# Patient Record
Sex: Female | Born: 1970
Health system: Southern US, Community
[De-identification: ages and names within clinical notes are randomized; demographics above are authoritative.]

## PROBLEM LIST (undated history)

## (undated) DIAGNOSIS — I10 Essential (primary) hypertension: Secondary | ICD-10-CM

## (undated) DIAGNOSIS — Z8639 Personal history of other endocrine, nutritional and metabolic disease: Secondary | ICD-10-CM

## (undated) DIAGNOSIS — D649 Anemia, unspecified: Secondary | ICD-10-CM

## (undated) HISTORY — DX: Hypocalcemia: E83.51

## (undated) HISTORY — PX: FOOT SURGERY: SHX648

## (undated) HISTORY — DX: Anemia, unspecified: D64.9

## (undated) HISTORY — DX: Personal history of other endocrine, nutritional and metabolic disease: Z86.39

## (undated) HISTORY — PX: OTHER SURGICAL HISTORY: SHX169

---

## 2015-11-13 LAB — CBC AND DIFFERENTIAL
HEMOGLOBIN: 9.9 — AB (ref 12.0–16.0)
WBC: 12.7

## 2016-01-05 ENCOUNTER — Emergency Department (HOSPITAL_BASED_OUTPATIENT_CLINIC_OR_DEPARTMENT_OTHER)
Admission: EM | Admit: 2016-01-05 | Discharge: 2016-01-05 | Disposition: A | Discharge status: Home / Self Care | Payer: Self-pay | Attending: Emergency Medicine | Admitting: Emergency Medicine

## 2016-01-05 ENCOUNTER — Encounter (HOSPITAL_BASED_OUTPATIENT_CLINIC_OR_DEPARTMENT_OTHER): Payer: Self-pay | Admitting: Emergency Medicine

## 2016-01-05 DIAGNOSIS — Z79899 Other long term (current) drug therapy: Secondary | ICD-10-CM | POA: Insufficient documentation

## 2016-01-05 DIAGNOSIS — I10 Essential (primary) hypertension: Secondary | ICD-10-CM | POA: Insufficient documentation

## 2016-01-05 DIAGNOSIS — R609 Edema, unspecified: Secondary | ICD-10-CM

## 2016-01-05 DIAGNOSIS — R0602 Shortness of breath: Secondary | ICD-10-CM | POA: Insufficient documentation

## 2016-01-05 DIAGNOSIS — R600 Localized edema: Secondary | ICD-10-CM | POA: Insufficient documentation

## 2016-01-05 HISTORY — DX: Essential (primary) hypertension: I10

## 2016-01-05 NOTE — ED Provider Notes (Signed)
CSN: [id number]     Arrival date & time 01/05/16  1624 History  By signing my name below, I, Rowan Blase, attest that this documentation has been prepared under the direction and in the presence of Leo Grosser, MD . Electronically Signed: Rowan Blase, Scribe. 01/05/2016. 5:16 PM.    Chief Complaint  Patient presents with  . Foot Pain    The history is provided by the patient. No language interpreter was used.   HPI Comments:  Mia Carlson is a 45 y.o. female with PMHx of HTN who presents to the Emergency Department complaining of recurrent right foot pain and swelling. Pt notes h/o injury to right foot 3 years ago. She recently went back to work and reports she has been standing for ~12 hours. Pt states she is not drinking much water while at work. No alleviating factors noted; she has worn and ankle sleeve without relief. Pt reports chronic shortness of breath when walking up stairs and laying flat. She states her blood pressure is "up and down"; she was recently started on amlodipine by her PCP. Denies h/o heart or lung problems, or abdominal pain.   Past Medical History  Diagnosis Date  . Hypertension    Past Surgical History  Procedure Laterality Date  . Foot surgery Right    History reviewed. No pertinent family history. Social History  Substance Use Topics  . Smoking status: Never Smoker   . Smokeless tobacco: None  . Alcohol Use: No   OB History    No data available     Review of Systems  Respiratory: Positive for shortness of breath.   Gastrointestinal: Negative for abdominal pain.  Musculoskeletal: Positive for joint swelling and arthralgias.  All other systems reviewed and are negative.  Allergies  Review of patient's allergies indicates not on file.  Home Medications   Prior to Admission medications   Medication Sig Start Date End Date Taking? Authorizing Provider  amLODipine-benazepril (LOTREL) 10-20 MG capsule Take 1 capsule by mouth daily.    Yes Historical Provider, MD   BP 169/79 mmHg  Pulse 85  Temp(Src) 98.4 F (36.9 C) (Oral)  Resp 18  Ht 5\' 3"  (1.6 m)  Wt 247 lb (112.038 kg)  BMI 43.76 kg/m2  SpO2 100%  LMP 12/03/2015   Physical Exam  Constitutional: She is oriented to person, place, and time. She appears well-developed and well-nourished. No distress.  HENT:  Head: Normocephalic.  Eyes: Conjunctivae are normal.  Neck: Neck supple. No tracheal deviation present.  Cardiovascular: Normal rate and regular rhythm.   Pulmonary/Chest: Effort normal. No respiratory distress.  Abdominal: Soft. She exhibits no distension.  Musculoskeletal:  1+ BL pitting edema of ankles  Neurological: She is alert and oriented to person, place, and time.  Skin: Skin is warm and dry.  Psychiatric: She has a normal mood and affect.    ED Course  Procedures  DIAGNOSTIC STUDIES:  Oxygen Saturation is 100% on RA, normal by my interpretation.    COORDINATION OF CARE:  5:11 PM Recommended pt wear compression stockings while working and elevate her feet at night. Will refer to new PCP. Discussed treatment plan with pt at bedside and pt agreed to plan.  Labs Review Labs Reviewed - No data to display  Imaging Review No results found. I have personally reviewed and evaluated these images and lab results as part of my medical decision-making.   EKG Interpretation None      MDM   Final diagnoses:  Peripheral edema  45 y.o. female presents with bilateral leg swelling and post-surgical right ankle that [place] more swollen likely 2/2 scar tissue but edema is evident on both legs equally. Suspect she is standing more with work and this represents stasis. No evidence of DVT. No evidence of heart failure or renal failure with clear explanation. Compression stockings and elevation at night recommended. Plan to follow up with PCP as needed and return precautions discussed for worsening or new concerning symptoms.   I personally performed  the services described in this documentation, which was scribed in my presence. The recorded information has been reviewed and is accurate.     Leo Grosser, MD 01/06/16 [phone]

## 2016-01-05 NOTE — Discharge Instructions (Signed)
Edema °Edema is an abnormal buildup of fluids in your body tissues. Edema is somewhat dependent on gravity to pull the fluid to the lowest place in your body. That makes the condition more common in the legs and thighs (lower extremities). Painless swelling of the feet and ankles is common and becomes more likely as you get older. It is also common in looser tissues, like around your eyes.  °When the affected area is squeezed, the fluid may move out of that spot and leave a dent for a few moments. This dent is called pitting.  °CAUSES  °There are many possible causes of edema. Eating too much salt and being on your feet or sitting for a long time can cause edema in your legs and ankles. Hot weather may make edema worse. Common medical causes of edema include: °· Heart failure. °· Liver disease. °· Kidney disease. °· Weak blood vessels in your legs. °· Cancer. °· An injury. °· Pregnancy. °· Some medications. °· Obesity.  °SYMPTOMS  °Edema is usually painless. Your skin may look swollen or shiny.  °DIAGNOSIS  °Your health care provider may be able to diagnose edema by asking about your medical history and doing a physical exam. You may need to have tests such as X-rays, an electrocardiogram, or blood tests to check for medical conditions that may cause edema.  °TREATMENT  °Edema treatment depends on the cause. If you have heart, liver, or kidney disease, you need the treatment appropriate for these conditions. General treatment may include: °· Elevation of the affected body part above the level of your heart. °· Compression of the affected body part. Pressure from elastic bandages or support stockings squeezes the tissues and forces fluid back into the blood vessels. This keeps fluid from entering the tissues. °· Restriction of fluid and salt intake. °· Use of a water pill (diuretic). These medications are appropriate only for some types of edema. They pull fluid out of your body and make you urinate more often. This  gets rid of fluid and reduces swelling, but diuretics can have side effects. Only use diuretics as directed by your health care provider. °HOME CARE INSTRUCTIONS  °· Keep the affected body part above the level of your heart when you are lying down.   °· Do not sit still or stand for prolonged periods.   °· Do not put anything directly under your knees when lying down. °· Do not wear constricting clothing or garters on your upper legs.   °· Exercise your legs to work the fluid back into your blood vessels. This may help the swelling go down.   °· Wear elastic bandages or support stockings to reduce ankle swelling as directed by your health care provider.   °· Eat a low-salt diet to reduce fluid if your health care provider recommends it.   °· Only take medicines as directed by your health care provider.  °SEEK MEDICAL CARE IF:  °· Your edema is not responding to treatment. °· You have heart, liver, or kidney disease and notice symptoms of edema. °· You have edema in your legs that does not improve after elevating them.   °· You have sudden and unexplained weight gain. °SEEK IMMEDIATE MEDICAL CARE IF:  °· You develop shortness of breath or chest pain.   °· You cannot breathe when you lie down. °· You develop pain, redness, or warmth in the swollen areas.   °· You have heart, liver, or kidney disease and suddenly get edema. °· You have a fever and your symptoms suddenly get worse. °MAKE SURE YOU:  °·   Understand these instructions.  Will watch your condition.  Will get help right away if you are not doing well or get worse.   This information is not intended to replace advice given to you by your health care provider. Make sure you discuss any questions you have with your health care provider.   Document Released: 07/05/2005 Document Revised: 07/26/2014 Document Reviewed: 04/27/2013 Elsevier Interactive Patient Education 2016 [employer]. How to Use Compression Stockings Compression stockings are elastic  socks that squeeze the legs. They help to increase blood flow to the legs, decrease swelling in the legs, and reduce the chance of developing blood clots in the lower legs. Compression stockings are often used by people who:  Are recovering from surgery.  Have poor circulation in their legs.  Are prone to getting blood clots in their legs.  Have varicose veins.  Sit or stay in bed for long periods of time. HOW TO USE COMPRESSION STOCKINGS Before you put on your compression stockings:  Make sure that they are the correct size. If you do not know your size, ask your health care provider.  Make sure that they are clean, dry, and in good condition.  Check them for rips and tears. Do not put them on if they are ripped or torn. Put your stockings on first thing in the morning, before you get out of bed. Keep them on for as long as your health care provider advises. When you are wearing your stockings:  Keep them as smooth as possible. Do not allow them to bunch up. It is especially important to prevent the stockings from bunching up around your toes or behind your knees.  Do not roll the stockings downward and leave them rolled down. This can decrease blood flow to your leg.  Change them right away if they become wet or dirty. When you take off your stockings, inspect your legs and feet. Anything that does not seem normal may require medical attention. Look for:  Open sores.  Red spots.  Swelling. INFORMATION AND TIPS  Do not stop wearing your compression stockings without talking to your health care provider first.  Wash your stockings everyday with mild detergent in cold or warm water. Do not use bleach. Air-dry your stockings or dry them in a clothes dryer on low heat.  Replace your stockings every 3-6 months.  If skin moisturizing is part of your treatment plan, apply lotion or cream at night so that your skin will be dry when you put on the stockings in the morning. It is  harder to put the stockings on when you have lotion on your legs or feet. SEEK MEDICAL CARE IF: Remove your stockings and seek medical care if:  You have a feeling of pins and needles in your feet or legs.  You have any new changes in your skin.  You have skin lesions that are getting worse.  You have swelling or pain that is getting worse. SEEK IMMEDIATE MEDICAL CARE IF:  You have numbness or tingling in your lower legs that does not get better immediately after you take the stockings off.  Your toes or feet become cold and blue.  You develop open sores or red spots on your legs that do not go away.  You see or feel a warm spot on your leg.  You have new swelling or soreness in your leg.  You are short of breath or you have chest pain for no reason.  You have a rapid  or irregular heartbeat.  You feel light-headed or dizzy.   This information is not intended to replace advice given to you by your health care provider. Make sure you discuss any questions you have with your health care provider.   Document Released: 05/02/2009 Document Revised: 11/19/2014 Document Reviewed: 06/12/2014 Elsevier Interactive Patient Education [employer].

## 2016-01-05 NOTE — ED Notes (Signed)
Pt states she injured her right foot 3 years ago and just recently started back to work and foot started swelling and had pain

## 2016-01-05 NOTE — ED Notes (Signed)
Ice applied

## 2017-02-19 ENCOUNTER — Emergency Department (HOSPITAL_COMMUNITY)
Admission: EM | Admit: 2017-02-19 | Discharge: 2017-02-19 | Disposition: A | Discharge status: Home / Self Care | Payer: Self-pay | Attending: Emergency Medicine | Admitting: Emergency Medicine

## 2017-02-19 ENCOUNTER — Encounter (HOSPITAL_COMMUNITY): Payer: Self-pay

## 2017-02-19 DIAGNOSIS — H1033 Unspecified acute conjunctivitis, bilateral: Secondary | ICD-10-CM

## 2017-02-19 DIAGNOSIS — J309 Allergic rhinitis, unspecified: Secondary | ICD-10-CM | POA: Insufficient documentation

## 2017-02-19 DIAGNOSIS — I10 Essential (primary) hypertension: Secondary | ICD-10-CM | POA: Insufficient documentation

## 2017-02-19 DIAGNOSIS — J3089 Other allergic rhinitis: Secondary | ICD-10-CM

## 2017-02-19 DIAGNOSIS — H109 Unspecified conjunctivitis: Secondary | ICD-10-CM | POA: Insufficient documentation

## 2017-02-19 MED ORDER — ERYTHROMYCIN 5 MG/GM OP OINT: 1.0000 "application " | TOPICAL_OINTMENT | Freq: Four times a day (QID) | OPHTHALMIC | 0 refills | Status: AC

## 2017-02-19 MED ORDER — AMLODIPINE BESY-BENAZEPRIL HCL 10-20 MG PO CAPS: 1.0000 | ORAL_CAPSULE | Freq: Every day | ORAL | 1 refills | Status: DC

## 2017-02-19 NOTE — ED Triage Notes (Signed)
Pt with bilateral redness and drainage to eyes.  X 2 days

## 2017-02-19 NOTE — ED Notes (Signed)
Bed: WTR7 Expected date:  Expected time:  Means of arrival:  Comments: 

## 2017-02-19 NOTE — ED Provider Notes (Signed)
[facility] DEPT Provider Note   CSN: [id number] Arrival date & time: 02/19/17  1515     History   Chief Complaint Chief Complaint  Patient presents with  . Eye Pain    HPI Mia Carlson is a 46 y.o. female with a PMHx of HTN, who presents to the ED with complaints of bilateral eye redness, itching, yellow crusting, and foreign body sensation 2 days. 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 She states 1 week ago she started having sneezing, nasal congestion, and rhinorrhea, but then 2 days ago she developed yellowish eye crusting together in the mornings, in addition to eye redness, itching, and FB sensation. Doesn't think anything has gone into her eyes. Symptoms worsen with dust exposure, and are mildly improved with zicam and benadryl use. She hasn't used anything specifically for her eyes. No known sick contacts, denies recent swimming, and does not wear contact lenses. She denies any vision changes, ear pain or drainage, sore throat, cough, fevers, chills, CP, SOB, abd pain, N/V/D/C, hematuria, dysuria, myalgias, arthralgias, numbness, tingling, focal weakness, or any other complaints at this time. She does not yet have a PCP here, she moved here about 1 year ago; she has run out of her BP meds about 11 mos ago due to not having a PCP. She denies headaches or LE swelling, or any other complaints related to her HTN at this time.    The history is provided by the patient and medical records. No language interpreter was used.  Eye Pain  This is a new problem. The current episode started 2 days ago. The problem occurs constantly. The problem has not changed since onset.Pertinent negatives include no chest pain, no abdominal pain, no headaches and no shortness of breath. Exacerbated by: dust exposure at work. The symptoms are relieved by medications.  Treatments tried: zicam and benadryl. The treatment provided mild relief.    Past Medical History:  Diagnosis Date  . Hypertension     There are no active problems to display for this patient.   Past Surgical History:  Procedure Laterality Date  . FOOT SURGERY Right     OB History    No data available       Home Medications    Prior to Admission medications   Medication Sig Start Date End Date Taking? Authorizing Provider  amLODipine-benazepril (LOTREL) 10-20 MG capsule Take 1 capsule by mouth daily.    [provider]    Family History History reviewed. No pertinent family history.  Social History Social History  Substance Use Topics  . Smoking status: Never Smoker  . Smokeless tobacco: Never Used  . Alcohol use No     Allergies   Patient has no known allergies.   Review of Systems Review of Systems  Constitutional: Negative for chills and fever.  HENT: Positive for congestion, rhinorrhea and sneezing. Negative for ear discharge, ear pain and sore throat.   Eyes: Positive for pain (FB sensation), discharge, redness and itching. Negative for visual disturbance.  Respiratory: Negative for cough and shortness of breath.   Cardiovascular: Negative for chest pain and leg swelling.  Gastrointestinal: Negative for abdominal pain, constipation, diarrhea, nausea and vomiting.  Genitourinary: Negative for dysuria and hematuria.  Musculoskeletal: Negative for arthralgias and myalgias.  Skin: Negative for color change.  Allergic/Immunologic: Positive for environmental allergies. Negative for immunocompromised  state.  Neurological: Negative for weakness, numbness and headaches.  Psychiatric/Behavioral: Negative for confusion.   All other systems reviewed and are negative for acute change except as noted in the HPI.    Physical Exam Updated Vital Signs BP (!) 176/105 (BP Location: Right Arm)   Pulse 90   Temp 98.4 F (36.9 C) (Oral)   Resp 20   LMP  01/31/2017   SpO2 96%   Physical Exam  Constitutional: She is oriented to person, place, and time. Vital signs are normal. She appears well-developed and well-nourished.  Non-toxic appearance. No distress.  Afebrile, nontoxic, NAD, HTN noted but similar to ED visit from 12/2015  HENT:  Head: Normocephalic and atraumatic.  Nose: Mucosal edema and rhinorrhea present.  Mouth/Throat: Uvula is midline, oropharynx is clear and moist and mucous membranes are normal. No trismus in the jaw. No uvula swelling. Tonsils are 0 on the right. Tonsils are 0 on the left. No tonsillar exudate.  Nose with mild congestion and clear rhinorrhea. Oropharynx clear and moist, without uvular swelling or deviation, no trismus or drooling, no tonsillar swelling or erythema, no exudates.    Eyes: Pupils are equal, round, and reactive to light. EOM are normal. Lids are everted and swept, no foreign bodies found. Right eye exhibits discharge (watery). Right eye exhibits no exudate. No foreign body present in the right eye. Left eye exhibits discharge (watery). Left eye exhibits no exudate. No foreign body present in the left eye. Right conjunctiva is injected. Right conjunctiva has no hemorrhage. Left conjunctiva is injected. Left conjunctiva has no hemorrhage.  PERRL, EOMI, no nystagmus, no visual field deficits B/l eyes with mild conjunctival injection, mild watery drainage, no crusting or purulent drainage, lids everted and swept and no FBs noted, eyelids slightly puffy but no periorbital swelling/erythema/warmth/tenderness, no pain with eye movement, no photophobia.   Neck: Normal range of motion. Neck supple.  Cardiovascular: Normal rate and intact distal pulses.   Pulmonary/Chest: Effort normal. No respiratory distress.  Abdominal: Normal appearance. She exhibits no distension.  Musculoskeletal: Normal range of motion.  Neurological: She is alert and oriented to person, place, and time. She has normal strength. No sensory  deficit.  Skin: Skin is warm, dry and intact. No rash noted.  Psychiatric: She has a normal mood and affect. Her behavior is normal.  Nursing note and vitals reviewed.    ED Treatments / Results  Labs (all labs ordered are listed, but only abnormal results are displayed) Labs Reviewed - No data to display  EKG  EKG Interpretation None       Radiology No results found.  Procedures Procedures (including critical care time)  Medications Ordered in ED Medications - No data to display   Initial Impression / Assessment and Plan / ED Course  I have reviewed the triage vital signs and the nursing notes.  Pertinent labs & imaging results that were available during my care of the patient were reviewed by me and considered in my medical decision making (see chart for details).     46 y.o. female here with b/l eye redness/itching/crusting x2 days, had allergic symptoms x1wk, thinks it's because she's working around dust and that has aggravated her allergy symptoms. On exam, mild b/l conjunctival injection, PERRL, EOMI, watery ocular drainage, no crusting, no photophobia. Mild nasal congestion/rhinorrhea. Likely allergic conjunctivitis, but given crusting, will empirically treat for bacterial cause. Doubt need for further emergent work up at this time. Advised OTC remedies for symptomatic relief as well, rx  erythromycin ointment given. Of note, BP high but similar to prior visit; states she has no PCP since moving here a year ago and she ran out of meds 11 mos ago; asymptomatic at this point; doubt need for emergent work up of this, but will provide courtesy refill of her HTN meds. Advised DASH diet. F/up with [facility] in 1wk for recheck of symptoms, and to establish medical care and further HTN management. Doubt need for emergent opthalmology f/up at this point.  I explained the diagnosis and have given explicit precautions to return to the ER including for any other new or worsening symptoms.  The patient understands and accepts the medical plan as it's been dictated and I have answered their questions. Discharge instructions concerning home care and prescriptions have been given. The patient is STABLE and is discharged to home in good condition.    Final Clinical Impressions(s) / ED Diagnoses   Final diagnoses:  Acute conjunctivitis of both eyes, unspecified acute conjunctivitis type  Essential hypertension  Non-seasonal allergic rhinitis, unspecified trigger    New Prescriptions New Prescriptions   AMLODIPINE-BENAZEPRIL (LOTREL) 10-20 MG CAPSULE    Take 1 capsule by mouth daily.   ERYTHROMYCIN OPHTHALMIC OINTMENT    Place 1 application into both eyes 4 (four) times daily. Apply thin 1/2 inch ribbon to both lower eyelids every 6 hours x 7 days     [address], [place], [place] 02/19/17 [place], Ankit, MD 02/20/17 1149

## 2017-02-19 NOTE — Discharge Instructions (Signed)
Conjunctivitis can be very contagious. Try to avoid rubbing eyes. Apply warm or cool compresses and use prescribed eye ointments as directed. Use over the counter allergy eye drops to help with symptoms, and use over the counter daily antihistamines like zyrtec or claritin to help with symptoms as well. Alternate between tylenol and motrin as needed for pain. Your blood pressure was high today, you must take your blood pressure medications as directed, which have been refilled for you as a courtesy today but future refills must be provided by a primary care provider; eat a low salt diet as well to help with blood pressure control. Follow up with the [facility] in 5-7 days for recheck of symptoms and to establish care for ongoing medical care and future medication refills. Return to the ER for changes or worsening symptoms.

## 2017-07-06 ENCOUNTER — Emergency Department (HOSPITAL_COMMUNITY)
Admission: EM | Admit: 2017-07-06 | Discharge: 2017-07-06 | Disposition: A | Discharge status: Home / Self Care | Payer: 59 | Attending: Emergency Medicine | Admitting: Emergency Medicine

## 2017-07-06 ENCOUNTER — Encounter (HOSPITAL_COMMUNITY): Payer: Self-pay | Admitting: Family Medicine

## 2017-07-06 DIAGNOSIS — Z0389 Encounter for observation for other suspected diseases and conditions ruled out: Secondary | ICD-10-CM | POA: Insufficient documentation

## 2017-07-06 DIAGNOSIS — Z79899 Other long term (current) drug therapy: Secondary | ICD-10-CM | POA: Insufficient documentation

## 2017-07-06 DIAGNOSIS — I10 Essential (primary) hypertension: Secondary | ICD-10-CM | POA: Diagnosis not present

## 2017-07-06 LAB — BASIC METABOLIC PANEL
ANION GAP: 6 (ref 5–15)
BUN: 8 mg/dL (ref 6–20)
CALCIUM: 8.4 mg/dL — AB (ref 8.9–10.3)
CHLORIDE: 107 mmol/L (ref 101–111)
CO2: 25 mmol/L (ref 22–32)
Creatinine, Ser: 0.77 mg/dL (ref 0.44–1.00)
GFR calc non Af Amer: >60 mL/min (ref >60)
Glucose, Bld: 101 mg/dL — ABNORMAL HIGH (ref 65–99)
Potassium: 3.4 mmol/L — ABNORMAL LOW (ref 3.5–5.1)
Sodium: 138 mmol/L (ref 135–145)

## 2017-07-06 LAB — URINALYSIS, ROUTINE W REFLEX MICROSCOPIC
Bilirubin Urine: NEGATIVE
GLUCOSE, UA: NEGATIVE mg/dL
HGB URINE DIPSTICK: NEGATIVE
Ketones, ur: NEGATIVE mg/dL
NITRITE: NEGATIVE
PH: 5 (ref 5.0–8.0)
PROTEIN: NEGATIVE mg/dL
Specific Gravity, Urine: 1.026 (ref 1.005–1.030)

## 2017-07-06 LAB — CBC WITH DIFFERENTIAL/PLATELET
BASOS ABS: 0 10*3/uL (ref 0.0–0.1)
Basophils Relative: 0 %
Eosinophils Absolute: 0.2 10*3/uL (ref 0.0–0.7)
Eosinophils Relative: 2 %
HEMATOCRIT: 29.9 % — AB (ref 36.0–46.0)
HEMOGLOBIN: 9.1 g/dL — AB (ref 12.0–15.0)
LYMPHS PCT: 28 %
Lymphs Abs: 3 10*3/uL (ref 0.7–4.0)
MCH: 22 pg — ABNORMAL LOW (ref 26.0–34.0)
MCHC: 30.4 g/dL (ref 30.0–36.0)
MCV: 72.4 fL — ABNORMAL LOW (ref 78.0–100.0)
Monocytes Absolute: 0.5 10*3/uL (ref 0.1–1.0)
Monocytes Relative: 4 %
NEUTROS ABS: 7 10*3/uL (ref 1.7–7.7)
NEUTROS PCT: 66 %
Platelets: 361 10*3/uL (ref 150–400)
RBC: 4.13 MIL/uL (ref 3.87–5.11)
RDW: 15.4 % (ref 11.5–15.5)
WBC: 10.7 10*3/uL — AB (ref 4.0–10.5)

## 2017-07-06 MED ORDER — BENAZEPRIL HCL 20 MG PO TABS: 20.0000 mg | ORAL_TABLET | Freq: Every day | ORAL | Status: DC

## 2017-07-06 MED ORDER — BENAZEPRIL HCL 20 MG PO TABS: 20.0000 mg | ORAL_TABLET | Freq: Every day | ORAL | 0 refills | Status: DC

## 2017-07-06 MED ORDER — AMLODIPINE BESYLATE 10 MG PO TABS: 10.0000 mg | ORAL_TABLET | Freq: Every day | ORAL | 0 refills | Status: DC

## 2017-07-06 MED ORDER — AMLODIPINE BESYLATE 5 MG PO TABS
10.0000 mg | ORAL_TABLET | Freq: Once | ORAL | Status: AC
  Administered 2017-07-06: 10 mg via ORAL
  Filled 2017-07-06: qty 2

## 2017-07-06 MED FILL — BENAZEPRIL HCL 20 MG TABLET: 20 | 30 days supply | Qty: 30 | Fill #0

## 2017-07-06 MED FILL — AMLODIPINE BESYLATE 10 MG T: 10 | 30 days supply | Qty: 30 | Fill #0
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
## 2017-07-06 NOTE — ED Triage Notes (Signed)
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 She reports she started lightheadedness while working with a headache. Patient reports she has a history of HTN but has not been able to afford her medication since June or July of 2018. Patient just got insurance with [employer] and has an appointment within the [facility] system.

## 2017-07-07 ENCOUNTER — Encounter: Payer: Self-pay | Admitting: Emergency Medicine

## 2017-07-07 ENCOUNTER — Encounter: Payer: Self-pay | Admitting: Family Medicine

## 2017-07-07 ENCOUNTER — Ambulatory Visit (INDEPENDENT_AMBULATORY_CARE_PROVIDER_SITE_OTHER): Payer: 59 | Admitting: Family Medicine

## 2017-07-07 VITALS — BP 160/110 | HR 86 | Temp 98.3°F | Wt 238.8 lb

## 2017-07-07 DIAGNOSIS — I10 Essential (primary) hypertension: Secondary | ICD-10-CM

## 2017-07-07 DIAGNOSIS — R600 Localized edema: Secondary | ICD-10-CM | POA: Diagnosis not present

## 2017-07-07 DIAGNOSIS — E876 Hypokalemia: Secondary | ICD-10-CM

## 2017-07-07 DIAGNOSIS — M79671 Pain in right foot: Secondary | ICD-10-CM

## 2017-07-07 DIAGNOSIS — Z7689 Persons encountering health services in other specified circumstances: Secondary | ICD-10-CM

## 2017-07-07 DIAGNOSIS — F5101 Primary insomnia: Secondary | ICD-10-CM | POA: Diagnosis not present

## 2017-07-07 DIAGNOSIS — D509 Iron deficiency anemia, unspecified: Secondary | ICD-10-CM

## 2017-07-07 MED ORDER — POTASSIUM CHLORIDE CRYS ER 20 MEQ PO TBCR: 20.0000 meq | EXTENDED_RELEASE_TABLET | Freq: Every day | ORAL | 0 refills | Status: DC

## 2017-07-07 MED ORDER — TRAMADOL HCL 50 MG PO TABS
50.0000 mg | ORAL_TABLET | Freq: Two times a day (BID) | ORAL | 0 refills | Status: DC | PRN
Start: 2017-07-07 — End: 2017-08-12

## 2017-07-07 MED ORDER — FERROUS SULFATE 325 (65 FE) MG PO TABS: 325.0000 mg | ORAL_TABLET | Freq: Every day | ORAL | 2 refills | Status: DC

## 2017-07-07 MED FILL — traMADol HCL 50 MG TABS: 50 | 15 days supply | Qty: 30 | Fill #0
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
## 2017-07-07 NOTE — Patient Instructions (Addendum)
DASH Eating Plan DASH stands for "Dietary Approaches to Stop Hypertension." The DASH eating plan is a healthy eating plan that has been shown to reduce high blood pressure (hypertension). It may also reduce your risk for type 2 diabetes, heart disease, and stroke. The DASH eating plan may also help with weight loss. What are tips for following this plan? General guidelines  Avoid eating more than 2,300 mg (milligrams) of salt (sodium) a day. If you have hypertension, you may need to reduce your sodium intake to 1,500 mg a day.  Limit alcohol intake to no more than 1 drink a day for nonpregnant women and 2 drinks a day for men. One drink equals 12 oz of beer, 5 oz of wine, or 1 oz of hard liquor.  Work with your health care provider to maintain a healthy body weight or to lose weight. Ask what an ideal weight is for you.  Get at least 30 minutes of exercise that causes your heart to beat faster (aerobic exercise) most days of the week. Activities may include walking, swimming, or biking.  Work with your health care provider or diet and nutrition specialist (dietitian) to adjust your eating plan to your individual calorie needs. Reading food labels  Check food labels for the amount of sodium per serving. Choose foods with less than 5 percent of the Daily Value of sodium. Generally, foods with less than 300 mg of sodium per serving fit into this eating plan.  To find whole grains, look for the word "whole" as the first word in the ingredient list. Shopping  Buy products labeled as "low-sodium" or "no salt added."  Buy fresh foods. Avoid canned foods and premade or frozen meals. Cooking  Avoid adding salt when cooking. Use salt-free seasonings or herbs instead of table salt or sea salt. Check with your health care provider or pharmacist before using salt substitutes.  Do not fry foods. Cook foods using healthy methods such as baking, boiling, grilling, and broiling instead.  Cook with  heart-healthy oils, such as olive, canola, soybean, or sunflower oil. Meal planning   Eat a balanced diet that includes: ? 5 or more servings of fruits and vegetables each day. At each meal, try to fill half of your plate with fruits and vegetables. ? Up to 6-8 servings of whole grains each day. ? Less than 6 oz of lean meat, poultry, or fish each day. A 3-oz serving of meat is about the same size as a deck of cards. One egg equals 1 oz. ? 2 servings of low-fat dairy each day. ? A serving of nuts, seeds, or beans 5 times each week. ? Heart-healthy fats. Healthy fats called Omega-3 fatty acids are found in foods such as flaxseeds and coldwater fish, like sardines, salmon, and mackerel.  Limit how much you eat of the following: ? Canned or prepackaged foods. ? Food that is high in trans fat, such as fried foods. ? Food that is high in saturated fat, such as fatty meat. ? Sweets, desserts, sugary drinks, and other foods with added sugar. ? Full-fat dairy products.  Do not salt foods before eating.  Try to eat at least 2 vegetarian meals each week.  Eat more home-cooked food and less restaurant, buffet, and fast food.  When eating at a restaurant, ask that your food be prepared with less salt or no salt, if possible. What foods are recommended? The items listed may not be a complete list. Talk with your dietitian about what   dietary choices are best for you. Grains Whole-grain or whole-wheat bread. Whole-grain or whole-wheat pasta. Brown rice. Oatmeal. Quinoa. Bulgur. Whole-grain and low-sodium cereals. Pita bread. Low-fat, low-sodium crackers. Whole-wheat flour tortillas. Vegetables Fresh or frozen vegetables (raw, steamed, roasted, or grilled). Low-sodium or reduced-sodium tomato and vegetable juice. Low-sodium or reduced-sodium tomato sauce and tomato paste. Low-sodium or reduced-sodium canned vegetables. Fruits All fresh, dried, or frozen fruit. Canned fruit in natural juice (without  added sugar). Meat and other protein foods Skinless chicken or turkey. Ground chicken or turkey. Pork with fat trimmed off. Fish and seafood. Egg whites. Dried beans, peas, or lentils. Unsalted nuts, nut butters, and seeds. Unsalted canned beans. Lean cuts of beef with fat trimmed off. Low-sodium, lean deli meat. Dairy Low-fat (1%) or fat-free (skim) milk. Fat-free, low-fat, or reduced-fat cheeses. Nonfat, low-sodium ricotta or cottage cheese. Low-fat or nonfat yogurt. Low-fat, low-sodium cheese. Fats and oils Soft margarine without trans fats. Vegetable oil. Low-fat, reduced-fat, or light mayonnaise and salad dressings (reduced-sodium). Canola, safflower, olive, soybean, and sunflower oils. Avocado. Seasoning and other foods Herbs. Spices. Seasoning mixes without salt. Unsalted popcorn and pretzels. Fat-free sweets. What foods are not recommended? The items listed may not be a complete list. Talk with your dietitian about what dietary choices are best for you. Grains Baked goods made with fat, such as croissants, muffins, or some breads. Dry pasta or rice meal packs. Vegetables Creamed or fried vegetables. Vegetables in a cheese sauce. Regular canned vegetables (not low-sodium or reduced-sodium). Regular canned tomato sauce and paste (not low-sodium or reduced-sodium). Regular tomato and vegetable juice (not low-sodium or reduced-sodium). Pickles. Olives. Fruits Canned fruit in a light or heavy syrup. Fried fruit. Fruit in cream or butter sauce. Meat and other protein foods Fatty cuts of meat. Ribs. Fried meat. Bacon. Sausage. Bologna and other processed lunch meats. Salami. Fatback. Hotdogs. Bratwurst. Salted nuts and seeds. Canned beans with added salt. Canned or smoked fish. Whole eggs or egg yolks. Chicken or turkey with skin. Dairy Whole or 2% milk, cream, and half-and-half. Whole or full-fat cream cheese. Whole-fat or sweetened yogurt. Full-fat cheese. Nondairy creamers. Whipped toppings.  Processed cheese and cheese spreads. Fats and oils Butter. Stick margarine. Lard. Shortening. Ghee. Bacon fat. Tropical oils, such as coconut, palm kernel, or palm oil. Seasoning and other foods Salted popcorn and pretzels. Onion salt, garlic salt, seasoned salt, table salt, and sea salt. Worcestershire sauce. Tartar sauce. Barbecue sauce. Teriyaki sauce. Soy sauce, including reduced-sodium. Steak sauce. Canned and packaged gravies. Fish sauce. Oyster sauce. Cocktail sauce. Horseradish that you find on the shelf. Ketchup. Mustard. Meat flavorings and tenderizers. Bouillon cubes. Hot sauce and Tabasco sauce. Premade or packaged marinades. Premade or packaged taco seasonings. Relishes. Regular salad dressings. Where to find more information:  National Heart, Lung, and Blood Institute: www.nhlbi.nih.gov  American Heart Association: www.heart.org Summary  The DASH eating plan is a healthy eating plan that has been shown to reduce high blood pressure (hypertension). It may also reduce your risk for type 2 diabetes, heart disease, and stroke.  With the DASH eating plan, you should limit salt (sodium) intake to 2,300 mg a day. If you have hypertension, you may need to reduce your sodium intake to 1,500 mg a day.  When on the DASH eating plan, aim to eat more fresh fruits and vegetables, whole grains, lean proteins, low-fat dairy, and heart-healthy fats.  Work with your health care provider or diet and nutrition specialist (dietitian) to adjust your eating plan to your individual   calorie needs. This information is not intended to replace advice given to you by your health care provider. Make sure you discuss any questions you have with your health care provider. Document Released: 06/24/2011 Document Revised: 06/28/2016 Document Reviewed: 06/28/2016 Elsevier Interactive Patient Education  2018 [employer].  Edema Edema is an abnormal buildup of fluids in your bodytissues. Edema is  somewhatdependent on gravity to pull the fluid to the lowest place in your body. That makes the condition more common in the legs and thighs (lower extremities). Painless swelling of the feet and ankles is common and becomes more likely as you get older. It is also common in looser tissues, like around your eyes. When the affected area is squeezed, the fluid may move out of that spot and leave a dent for a few moments. This dent is called pitting. What are the causes? There are many possible causes of edema. Eating too much salt and being on your feet or sitting for a long time can cause edema in your legs and ankles. Hot weather may make edema worse. Common medical causes of edema include:  Heart failure.  Liver disease.  Kidney disease.  Weak blood vessels in your legs.  Cancer.  An injury.  Pregnancy.  Some medications.  Obesity.  What are the signs or symptoms? Edema is usually painless.Your skin may look swollen or shiny. How is this diagnosed? Your health care provider may be able to diagnose edema by asking about your medical history and doing a physical exam. You may need to have tests such as X-rays, an electrocardiogram, or blood tests to check for medical conditions that may cause edema. How is this treated? Edema treatment depends on the cause. If you have heart, liver, or kidney disease, you need the treatment appropriate for these conditions. General treatment may include:  Elevation of the affected body part above the level of your heart.  Compression of the affected body part. Pressure from elastic bandages or support stockings squeezes the tissues and forces fluid back into the blood vessels. This keeps fluid from entering the tissues.  Restriction of fluid and salt intake.  Use of a water pill (diuretic). These medications are appropriate only for some types of edema. They pull fluid out of your body and make you urinate more often. This gets rid of fluid and  reduces swelling, but diuretics can have side effects. Only use diuretics as directed by your health care provider.  Follow these instructions at home:  Keep the affected body part above the level of your heart when you are lying down.  Do not sit still or stand for prolonged periods.  Do not put anything directly under your knees when lying down.  Do not wear constricting clothing or garters on your upper legs.  Exercise your legs to work the fluid back into your blood vessels. This may help the swelling go down.  Wear elastic bandages or support stockings to reduce ankle swelling as directed by your health care provider.  Eat a low-salt diet to reduce fluid if your health care provider recommends it.  Only take medicines as directed by your health care provider. Contact a health care provider if:  Your edema is not responding to treatment.  You have heart, liver, or kidney disease and notice symptoms of edema.  You have edema in your legs that does not improve after elevating them.  You have sudden and unexplained weight gain. Get help right away if:  You develop shortness  of breath or chest pain.  You cannot breathe when you lie down.  You develop pain, redness, or warmth in the swollen areas.  You have heart, liver, or kidney disease and suddenly get edema.  You have a fever and your symptoms suddenly get worse. This information is not intended to replace advice given to you by your health care provider. Make sure you discuss any questions you have with your health care provider. Document Released: 07/05/2005 Document Revised: 12/11/2015 Document Reviewed: 04/27/2013 Elsevier Interactive Patient Education  2017 [employer].  Managing Your Hypertension Hypertension is commonly called high blood pressure. This is when the force of your blood pressing against the walls of your arteries is too strong. Arteries are blood vessels that carry blood from your heart throughout  your body. Hypertension forces the heart to work harder to pump blood, and may cause the arteries to become narrow or stiff. Having untreated or uncontrolled hypertension can cause heart attack, stroke, kidney disease, and other problems. What are blood pressure readings? A blood pressure reading consists of a higher number over a lower number. Ideally, your blood pressure should be below 120/80. The first ("top") number is called the systolic pressure. It is a measure of the pressure in your arteries as your heart beats. The second ("bottom") number is called the diastolic pressure. It is a measure of the pressure in your arteries as the heart relaxes. What does my blood pressure reading mean? Blood pressure is classified into four stages. Based on your blood pressure reading, your health care provider may use the following stages to determine what type of treatment you need, if any. Systolic pressure and diastolic pressure are measured in a unit called mm Hg. Normal  Systolic pressure: below 191.  Diastolic pressure: below 80. Elevated  Systolic pressure: 478-295.  Diastolic pressure: below 80. Hypertension stage 1  Systolic pressure: 621-308.  Diastolic pressure: 65-78. Hypertension stage 2  Systolic pressure: 469 or above.  Diastolic pressure: 90 or above. What health risks are associated with hypertension? Managing your hypertension is an important responsibility. Uncontrolled hypertension can lead to:  A heart attack.  A stroke.  A weakened blood vessel (aneurysm).  Heart failure.  Kidney damage.  Eye damage.  Metabolic syndrome.  Memory and concentration problems.  What changes can I make to manage my hypertension? Hypertension can be managed by making lifestyle changes and possibly by taking medicines. Your health care provider will help you make a plan to bring your blood pressure within a normal range. Eating and drinking  Eat a diet that is high in fiber and  potassium, and low in salt (sodium), added sugar, and fat. An example eating plan is called the DASH (Dietary Approaches to Stop Hypertension) diet. To eat this way: ? Eat plenty of fresh fruits and vegetables. Try to fill half of your plate at each meal with fruits and vegetables. ? Eat whole grains, such as whole wheat pasta, brown rice, or whole grain bread. Fill about one quarter of your plate with whole grains. ? Eat low-fat diary products. ? Avoid fatty cuts of meat, processed or cured meats, and poultry with skin. Fill about one quarter of your plate with lean proteins such as fish, chicken without skin, beans, eggs, and tofu. ? Avoid premade and processed foods. These tend to be higher in sodium, added sugar, and fat.  Reduce your daily sodium intake. Most people with hypertension should eat less than 1,500 mg of sodium a day.  Limit  alcohol intake to no more than 1 drink a day for nonpregnant women and 2 drinks a day for men. One drink equals 12 oz of beer, 5 oz of wine, or 1 oz of hard liquor. Lifestyle  Work with your health care provider to maintain a healthy body weight, or to lose weight. Ask what an ideal weight is for you.  Get at least 30 minutes of exercise that causes your heart to beat faster (aerobic exercise) most days of the week. Activities may include walking, swimming, or biking.  Include exercise to strengthen your muscles (resistance exercise), such as weight lifting, as part of your weekly exercise routine. Try to do these types of exercises for 30 minutes at least 3 days a week.  Do not use any products that contain nicotine or tobacco, such as cigarettes and e-cigarettes. If you need help quitting, ask your health care provider.  Control any long-term (chronic) conditions you have, such as high cholesterol or diabetes. Monitoring  Monitor your blood pressure at home as told by your health care provider. Your personal target blood pressure may vary depending on  your medical conditions, your age, and other factors.  Have your blood pressure checked regularly, as often as told by your health care provider. Working with your health care provider  Review all the medicines you take with your health care provider because there may be side effects or interactions.  Talk with your health care provider about your diet, exercise habits, and other lifestyle factors that may be contributing to hypertension.  Visit your health care provider regularly. Your health care provider can help you create and adjust your plan for managing hypertension. Will I need medicine to control my blood pressure? Your health care provider may prescribe medicine if lifestyle changes are not enough to get your blood pressure under control, and if:  Your systolic blood pressure is 130 or higher.  Your diastolic blood pressure is 80 or higher.  Take medicines only as told by your health care provider. Follow the directions carefully. Blood pressure medicines must be taken as prescribed. The medicine does not work as well when you skip doses. Skipping doses also puts you at risk for problems. Contact a health care provider if:  You think you are having a reaction to medicines you have taken.  You have repeated (recurrent) headaches.  You feel dizzy.  You have swelling in your ankles.  You have trouble with your vision. Get help right away if:  You develop a severe headache or confusion.  You have unusual weakness or numbness, or you feel faint.  You have severe pain in your chest or abdomen.  You vomit repeatedly.  You have trouble breathing. Summary  Hypertension is when the force of blood pumping through your arteries is too strong. If this condition is not controlled, it may put you at risk for serious complications.  Your personal target blood pressure may vary depending on your medical conditions, your age, and other factors. For most people, a normal blood  pressure is less than 120/80.  Hypertension is managed by lifestyle changes, medicines, or both. Lifestyle changes include weight loss, eating a healthy, low-sodium diet, exercising more, and limiting alcohol. This information is not intended to replace advice given to you by your health care provider. Make sure you discuss any questions you have with your health care provider. Document Released: 03/29/2012 Document Revised: 06/02/2016 Document Reviewed: 06/02/2016 Elsevier Interactive Patient Education  2018 [employer].  How to  Take Your Blood Pressure You can take your blood pressure at home with a machine. You may need to check your blood pressure at home:  To check if you have high blood pressure (hypertension).  To check your blood pressure over time.  To make sure your blood pressure medicine is working.  Supplies needed: You will need a blood pressure machine, or monitor. You can buy one at a drugstore or online. When choosing one:  Choose one with an arm cuff.  Choose one that wraps around your upper arm. Only one finger should fit between your arm and the cuff.  Do not choose one that measures your blood pressure from your wrist or finger.  Your doctor can suggest a monitor. How to prepare Avoid these things for 30 minutes before checking your blood pressure:  Drinking caffeine.  Drinking alcohol.  Eating.  Smoking.  Exercising.  Five minutes before checking your blood pressure:  Pee.  Sit in a dining chair. Avoid sitting in a soft couch or armchair.  Be quiet. Do not talk.  How to take your blood pressure Follow the instructions that came with your machine. If you have a digital blood pressure monitor, these may be the instructions: 1. Sit up straight. 2. Place your feet on the floor. Do not cross your ankles or legs. 3. Rest your left arm at the level of your heart. You may rest it on a table, desk, or chair. 4. Pull up your shirt sleeve. 5. Wrap the  blood pressure cuff around the upper part of your left arm. The cuff should be 1 inch (2.5 cm) above your elbow. It is best to wrap the cuff around bare skin. 6. Fit the cuff snugly around your arm. You should be able to place only one finger between the cuff and your arm. 7. Put the cord inside the groove of your elbow. 8. Press the power button. 9. Sit quietly while the cuff fills with air and loses air. 10. Write down the numbers on the screen. 11. Wait 2-3 minutes and then repeat steps 1-10.  What do the numbers mean? Two numbers make up your blood pressure. The first number is called systolic pressure. The second is called diastolic pressure. An example of a blood pressure reading is "120 over 80" (or 120/80). If you are an adult and do not have a medical condition, use this guide to find out if your blood pressure is normal: Normal  First number: below 120.  Second number: below 80. Elevated  First number: 120-129.  Second number: below 80. Hypertension stage 1  First number: 130-139.  Second number: 80-89. Hypertension stage 2  First number: 140 or above.  Second number: 68 or above. Your blood pressure is above normal even if only the top or bottom number is above normal. Follow these instructions at home:  Check your blood pressure as often as your doctor tells you to.  Take your monitor to your next doctor's appointment. Your doctor will: ? Make sure you are using it correctly. ? Make sure it is working right.  Make sure you understand what your blood pressure numbers should be.  Tell your doctor if your medicines are causing side effects. Contact a doctor if:  Your blood pressure keeps being high. Get help right away if:  Your first blood pressure number is higher than 180.  Your second blood pressure number is higher than 120. This information is not intended to replace advice given to you  by your health care provider. Make sure you discuss any questions  you have with your health care provider. Document Released: 06/17/2008 Document Revised: 06/02/2016 Document Reviewed: 12/12/2015 Elsevier Interactive Patient Education  [employer].

## 2017-07-08 ENCOUNTER — Other Ambulatory Visit: Payer: Self-pay

## 2017-07-08 ENCOUNTER — Other Ambulatory Visit: Payer: Self-pay | Admitting: Family Medicine

## 2017-07-08 ENCOUNTER — Encounter: Payer: Self-pay | Admitting: Family Medicine

## 2017-07-08 DIAGNOSIS — Z1231 Encounter for screening mammogram for malignant neoplasm of breast: Secondary | ICD-10-CM

## 2017-07-08 DIAGNOSIS — M79671 Pain in right foot: Secondary | ICD-10-CM

## 2017-07-08 MED FILL — POTASSIUM CL ER 20 MEQ TAB: 20 | 30 days supply | Qty: 30 | Fill #0

## 2017-07-14 ENCOUNTER — Encounter: Payer: Self-pay | Admitting: Family Medicine

## 2017-07-21 ENCOUNTER — Ambulatory Visit (INDEPENDENT_AMBULATORY_CARE_PROVIDER_SITE_OTHER): Payer: Self-pay | Admitting: Family Medicine

## 2017-07-21 ENCOUNTER — Encounter: Payer: Self-pay | Admitting: Family Medicine

## 2017-07-21 VITALS — BP 140/80 | HR 80 | Temp 98.4°F | Wt 238.5 lb

## 2017-07-21 DIAGNOSIS — G4700 Insomnia, unspecified: Secondary | ICD-10-CM | POA: Insufficient documentation

## 2017-07-21 DIAGNOSIS — M79671 Pain in right foot: Secondary | ICD-10-CM | POA: Insufficient documentation

## 2017-07-21 DIAGNOSIS — I10 Essential (primary) hypertension: Secondary | ICD-10-CM

## 2017-07-21 DIAGNOSIS — D508 Other iron deficiency anemias: Secondary | ICD-10-CM

## 2017-07-21 DIAGNOSIS — D500 Iron deficiency anemia secondary to blood loss (chronic): Secondary | ICD-10-CM | POA: Insufficient documentation

## 2017-07-21 DIAGNOSIS — D509 Iron deficiency anemia, unspecified: Secondary | ICD-10-CM | POA: Insufficient documentation

## 2017-07-21 MED ORDER — BENAZEPRIL HCL 40 MG PO TABS: 40.0000 mg | ORAL_TABLET | Freq: Every day | ORAL | 3 refills | Status: DC
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
## 2017-07-21 NOTE — Patient Instructions (Addendum)
You can try using Miralax as needed for constipation caused by the Iron pills you are taking.  Continue checking your blood pressure daily.  Continue to drink plenty of water and fluids each day.  At your next office visit, bring your blood pressure cuff with you. DASH Eating Plan DASH stands for "Dietary Approaches to Stop Hypertension." The DASH eating plan is a healthy eating plan that has been shown to reduce high blood pressure (hypertension). It may also reduce your risk for type 2 diabetes, heart disease, and stroke. The DASH eating plan may also help with weight loss. What are tips for following this plan? General guidelines  Avoid eating more than 2,300 mg (milligrams) of salt (sodium) a day. If you have hypertension, you may need to reduce your sodium intake to 1,500 mg a day.  Limit alcohol intake to no more than 1 drink a day for nonpregnant women and 2 drinks a day for men. One drink equals 12 oz of beer, 5 oz of wine, or 1 oz of hard liquor.  Work with your health care provider to maintain a healthy body weight or to lose weight. Ask what an ideal weight is for you.  Get at least 30 minutes of exercise that causes your heart to beat faster (aerobic exercise) most days of the week. Activities may include walking, swimming, or biking.  Work with your health care provider or diet and nutrition specialist (dietitian) to adjust your eating plan to your individual calorie needs. Reading food labels  Check food labels for the amount of sodium per serving. Choose foods with less than 5 percent of the Daily Value of sodium. Generally, foods with less than 300 mg of sodium per serving fit into this eating plan.  To find whole grains, look for the word "whole" as the first word in the ingredient list. Shopping  Buy products labeled as "low-sodium" or "no salt added."  Buy fresh foods. Avoid canned foods and premade or frozen meals. Cooking  Avoid adding salt when cooking. Use  salt-free seasonings or herbs instead of table salt or sea salt. Check with your health care provider or pharmacist before using salt substitutes.  Do not fry foods. Cook foods using healthy methods such as baking, boiling, grilling, and broiling instead.  Cook with heart-healthy oils, such as olive, canola, soybean, or sunflower oil. Meal planning   Eat a balanced diet that includes: ? 5 or more servings of fruits and vegetables each day. At each meal, try to fill half of your plate with fruits and vegetables. ? Up to 6-8 servings of whole grains each day. ? Less than 6 oz of lean meat, poultry, or fish each day. A 3-oz serving of meat is about the same size as a deck of cards. One egg equals 1 oz. ? 2 servings of low-fat dairy each day. ? A serving of nuts, seeds, or beans 5 times each week. ? Heart-healthy fats. Healthy fats called Omega-3 fatty acids are found in foods such as flaxseeds and coldwater fish, like sardines, salmon, and mackerel.  Limit how much you eat of the following: ? Canned or prepackaged foods. ? Food that is high in trans fat, such as fried foods. ? Food that is high in saturated fat, such as fatty meat. ? Sweets, desserts, sugary drinks, and other foods with added sugar. ? Full-fat dairy products.  Do not salt foods before eating.  Try to eat at least 2 vegetarian meals each week.  Eat more  home-cooked food and less restaurant, buffet, and fast food.  When eating at a restaurant, ask that your food be prepared with less salt or no salt, if possible. What foods are recommended? The items listed may not be a complete list. Talk with your dietitian about what dietary choices are best for you. Grains Whole-grain or whole-wheat bread. Whole-grain or whole-wheat pasta. Brown rice. [place]. Bulgur. Whole-grain and low-sodium cereals. Pita bread. Low-fat, low-sodium crackers. Whole-wheat flour tortillas. Vegetables Fresh or frozen vegetables (raw, steamed,  roasted, or grilled). Low-sodium or reduced-sodium tomato and vegetable juice. Low-sodium or reduced-sodium tomato sauce and tomato paste. Low-sodium or reduced-sodium canned vegetables. Fruits All fresh, dried, or frozen fruit. Canned fruit in natural juice (without added sugar). Meat and other protein foods Skinless chicken or [place]. Ground chicken or [place]. Pork with fat trimmed off. Fish and seafood. Egg whites. Dried beans, peas, or lentils. Unsalted nuts, nut butters, and seeds. Unsalted canned beans. Lean cuts of beef with fat trimmed off. Low-sodium, lean deli meat. Dairy Low-fat (1%) or fat-free (skim) milk. Fat-free, low-fat, or reduced-fat cheeses. Nonfat, low-sodium ricotta or cottage cheese. Low-fat or nonfat yogurt. Low-fat, low-sodium cheese. Fats and oils Soft margarine without trans fats. Vegetable oil. Low-fat, reduced-fat, or light mayonnaise and salad dressings (reduced-sodium). Canola, safflower, olive, soybean, and sunflower oils. Avocado. Seasoning and other foods Herbs. Spices. Seasoning mixes without salt. Unsalted popcorn and pretzels. Fat-free sweets. What foods are not recommended? The items listed may not be a complete list. Talk with your dietitian about what dietary choices are best for you. Grains Baked goods made with fat, such as croissants, muffins, or some breads. Dry pasta or rice meal packs. Vegetables Creamed or fried vegetables. Vegetables in a cheese sauce. Regular canned vegetables (not low-sodium or reduced-sodium). Regular canned tomato sauce and paste (not low-sodium or reduced-sodium). Regular tomato and vegetable juice (not low-sodium or reduced-sodium). Angie Fava. Olives. Fruits Canned fruit in a light or heavy syrup. Fried fruit. Fruit in cream or butter sauce. Meat and other protein foods Fatty cuts of meat. Ribs. Fried meat. Berniece Salines. Sausage. Bologna and other processed lunch meats. Salami. Fatback. Hotdogs. Bratwurst. Salted nuts and seeds. Canned  beans with added salt. Canned or smoked fish. Whole eggs or egg yolks. Chicken or [place] with skin. Dairy Whole or 2% milk, cream, and half-and-half. Whole or full-fat cream cheese. Whole-fat or sweetened yogurt. Full-fat cheese. Nondairy creamers. Whipped toppings. Processed cheese and cheese spreads. Fats and oils Butter. Stick margarine. Lard. Shortening. Ghee. Bacon fat. Tropical oils, such as coconut, palm kernel, or palm oil. Seasoning and other foods Salted popcorn and pretzels. Onion salt, garlic salt, seasoned salt, table salt, and sea salt. Worcestershire sauce. Tartar sauce. Barbecue sauce. Teriyaki sauce. Soy sauce, including reduced-sodium. Steak sauce. Canned and packaged gravies. Fish sauce. Oyster sauce. Cocktail sauce. Horseradish that you find on the shelf. Ketchup. Mustard. Meat flavorings and tenderizers. Bouillon cubes. Hot sauce and Tabasco sauce. Premade or packaged marinades. Premade or packaged taco seasonings. Relishes. Regular salad dressings. Where to find more information:  National Heart, Lung, and [place]: https://wilson-eaton.com/  American Heart Association: www.heart.org Summary  The DASH eating plan is a healthy eating plan that has been shown to reduce high blood pressure (hypertension). It may also reduce your risk for type 2 diabetes, heart disease, and stroke.  With the DASH eating plan, you should limit salt (sodium) intake to 2,300 mg a day. If you have hypertension, you may need to reduce your sodium intake to 1,500  mg a day.  When on the DASH eating plan, aim to eat more fresh fruits and vegetables, whole grains, lean proteins, low-fat dairy, and heart-healthy fats.  Work with your health care provider or diet and nutrition specialist (dietitian) to adjust your eating plan to your individual calorie needs. This information is not intended to replace advice given to you by your health care provider. Make sure you discuss any questions you have with your  health care provider. Document Released: 06/24/2011 Document Revised: 06/28/2016 Document Reviewed: 06/28/2016 Elsevier Interactive Patient Education  2018 [employer].  How to Take Your Blood Pressure You can take your blood pressure at home with a machine. You may need to check your blood pressure at home:  To check if you have high blood pressure (hypertension).  To check your blood pressure over time.  To make sure your blood pressure medicine is working.  Supplies needed: You will need a blood pressure machine, or monitor. You can buy one at a drugstore or online. When choosing one:  Choose one with an arm cuff.  Choose one that wraps around your upper arm. Only one finger should fit between your arm and the cuff.  Do not choose one that measures your blood pressure from your wrist or finger.  Your doctor can suggest a monitor. How to prepare Avoid these things for 30 minutes before checking your blood pressure:  Drinking caffeine.  Drinking alcohol.  Eating.  Smoking.  Exercising.  Five minutes before checking your blood pressure:  Pee.  Sit in a dining chair. Avoid sitting in a soft couch or armchair.  Be quiet. Do not talk.  How to take your blood pressure Follow the instructions that came with your machine. If you have a digital blood pressure monitor, these may be the instructions: 1. Sit up straight. 2. Place your feet on the floor. Do not cross your ankles or legs. 3. Rest your left arm at the level of your heart. You may rest it on a table, desk, or chair. 4. Pull up your shirt sleeve. 5. Wrap the blood pressure cuff around the upper part of your left arm. The cuff should be 1 inch (2.5 cm) above your elbow. It is best to wrap the cuff around bare skin. 6. Fit the cuff snugly around your arm. You should be able to place only one finger between the cuff and your arm. 7. Put the cord inside the groove of your elbow. 8. Press the power button. 9. Sit  quietly while the cuff fills with air and loses air. 10. Write down the numbers on the screen. 11. Wait 2-3 minutes and then repeat steps 1-10.  What do the numbers mean? Two numbers make up your blood pressure. The first number is called systolic pressure. The second is called diastolic pressure. An example of a blood pressure reading is "120 over 80" (or 120/80). If you are an adult and do not have a medical condition, use this guide to find out if your blood pressure is normal: Normal  First number: below 120.  Second number: below 80. Elevated  First number: 120-129.  Second number: below 80. Hypertension stage 1  First number: 130-139.  Second number: 80-89. Hypertension stage 2  First number: 140 or above.  Second number: 37 or above. Your blood pressure is above normal even if only the top or bottom number is above normal. Follow these instructions at home:  Check your blood pressure as often as your doctor  tells you to.  Take your monitor to your next doctor's appointment. Your doctor will: ? Make sure you are using it correctly. ? Make sure it is working right.  Make sure you understand what your blood pressure numbers should be.  Tell your doctor if your medicines are causing side effects. Contact a doctor if:  Your blood pressure keeps being high. Get help right away if:  Your first blood pressure number is higher than 180.  Your second blood pressure number is higher than 120. This information is not intended to replace advice given to you by your health care provider. Make sure you discuss any questions you have with your health care provider. Document Released: 06/17/2008 Document Revised: 06/02/2016 Document Reviewed: 12/12/2015 Elsevier Interactive Patient Education  [employer].

## 2017-07-28 MED FILL — BENAZEPRIL HCL 40 MG TABLET: 40 | 30 days supply | Qty: 30 | Fill #0

## 2017-08-01 ENCOUNTER — Other Ambulatory Visit: Payer: Self-pay | Admitting: Emergency Medicine

## 2017-08-01 ENCOUNTER — Encounter: Payer: Self-pay | Admitting: Family Medicine

## 2017-08-01 MED ORDER — AMLODIPINE BESYLATE 10 MG PO TABS: 10.0000 mg | ORAL_TABLET | Freq: Every day | ORAL | 0 refills | Status: DC

## 2017-08-04 ENCOUNTER — Ambulatory Visit: Payer: 59

## 2017-08-04 ENCOUNTER — Ambulatory Visit: Payer: Self-pay | Admitting: Family Medicine

## 2017-08-05 MED FILL — AMLODIPINE BESYLATE 10 MG T: 10 | 90 days supply | Qty: 90 | Fill #0

## 2017-08-12 ENCOUNTER — Ambulatory Visit (INDEPENDENT_AMBULATORY_CARE_PROVIDER_SITE_OTHER): Payer: Self-pay | Admitting: Family Medicine

## 2017-08-12 ENCOUNTER — Encounter: Payer: Self-pay | Admitting: Family Medicine

## 2017-08-12 VITALS — BP 140/94 | HR 79 | Temp 98.3°F | Wt 234.3 lb

## 2017-08-12 DIAGNOSIS — J069 Acute upper respiratory infection, unspecified: Secondary | ICD-10-CM

## 2017-08-12 DIAGNOSIS — M79671 Pain in right foot: Secondary | ICD-10-CM

## 2017-08-12 MED ORDER — TRAMADOL HCL 50 MG PO TABS: 50.0000 mg | ORAL_TABLET | Freq: Two times a day (BID) | ORAL | 1 refills | Status: DC | PRN

## 2017-08-12 MED ORDER — FLUTICASONE PROPIONATE 50 MCG/ACT NA SUSP: 1.0000 | Freq: Every day | NASAL | 0 refills | Status: DC

## 2017-08-12 MED FILL — FLUTICASONE PROP 50 MCG SPR: 50 | 60 days supply | Qty: 16 | Fill #0

## 2017-08-12 MED FILL — traMADol HCL 50 MG TABS: 50 | 30 days supply | Qty: 60 | Fill #0

## 2017-08-12 NOTE — Patient Instructions (Addendum)
Coricidin HBP is a cold medicine that will not affect her blood pressure is much as others.  It can be found on the shelf at the drugstore.   Viral Respiratory Infection A respiratory infection is an illness that affects part of the respiratory system, such as the lungs, nose, or throat. Most respiratory infections are caused by either viruses or bacteria. A respiratory infection that is caused by a virus is called a viral respiratory infection. Common types of viral respiratory infections include:  A cold.  The flu (influenza).  A respiratory syncytial virus (RSV) infection.  How do I know if I have a viral respiratory infection? Most viral respiratory infections cause:  A stuffy or runny nose.  Yellow or green nasal discharge.  A cough.  Sneezing.  Fatigue.  Achy muscles.  A sore throat.  Sweating or chills.  A fever.  A headache.  How are viral respiratory infections treated? If influenza is diagnosed early, it may be treated with an antiviral medicine that shortens the length of time a person has symptoms. Symptoms of viral respiratory infections may be treated with over-the-counter and prescription medicines, such as:  Expectorants. These make it easier to cough up mucus.  Decongestant nasal sprays.  Health care providers do not prescribe antibiotic medicines for viral infections. This is because antibiotics are designed to kill bacteria. They have no effect on viruses. How do I know if I should stay home from work or school? To avoid exposing others to your respiratory infection, stay home if you have:  A fever.  A persistent cough.  A sore throat.  A runny nose.  Sneezing.  Muscles aches.  Headaches.  Fatigue.  Weakness.  Chills.  Sweating.  Nausea.  Follow these instructions at home:  Rest as much as possible.  Take over-the-counter and prescription medicines only as told by your health care provider.  Drink enough fluid to keep  your urine clear or pale yellow. This helps prevent dehydration and helps loosen up mucus.  Gargle with a salt-water mixture 3-4 times per day or as needed. To make a salt-water mixture, completely dissolve -1 tsp of salt in 1 cup of warm water.  Use nose drops made from salt water to ease congestion and soften raw skin around your nose.  Do not drink alcohol.  Do not use tobacco products, including cigarettes, chewing tobacco, and e-cigarettes. If you need help quitting, ask your health care provider. Contact a health care provider if:  Your symptoms last for 10 days or longer.  Your symptoms get worse over time.  You have a fever.  You have severe sinus pain in your face or forehead.  The glands in your jaw or neck become very swollen. Get help right away if:  You feel pain or pressure in your chest.  You have shortness of breath.  You faint or feel like you will faint.  You have severe and persistent vomiting.  You feel confused or disoriented. This information is not intended to replace advice given to you by your health care provider. Make sure you discuss any questions you have with your health care provider. Document Released: 04/14/2005 Document Revised: 12/11/2015 Document Reviewed: 12/11/2014 Elsevier Interactive Patient Education  [employer].
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
## 2017-08-15 ENCOUNTER — Encounter: Payer: Self-pay | Admitting: Family Medicine

## 2017-08-22 ENCOUNTER — Encounter (HOSPITAL_COMMUNITY): Payer: 59

## 2017-08-22 ENCOUNTER — Encounter: Payer: 59 | Admitting: Surgery

## 2017-08-26 ENCOUNTER — Encounter: Payer: Self-pay | Admitting: Family Medicine

## 2017-08-26 ENCOUNTER — Ambulatory Visit (INDEPENDENT_AMBULATORY_CARE_PROVIDER_SITE_OTHER): Payer: Self-pay | Admitting: Family Medicine

## 2017-08-26 VITALS — BP 100/78 | HR 90 | Temp 98.3°F | Ht 63.0 in | Wt 234.6 lb

## 2017-08-26 DIAGNOSIS — M545 Low back pain, unspecified: Secondary | ICD-10-CM

## 2017-08-26 MED ORDER — CYCLOBENZAPRINE HCL 5 MG PO TABS: 5.0000 mg | ORAL_TABLET | Freq: Three times a day (TID) | ORAL | 0 refills | Status: DC | PRN

## 2017-08-26 MED FILL — CYCLOBENZAPRINE 5 MG TABLET: 5 | 10 days supply | Qty: 30 | Fill #0

## 2017-08-26 NOTE — Patient Instructions (Addendum)
Back Exercises If you have pain in your back, do these exercises 2-3 times each day or as told by your doctor. When the pain goes away, do the exercises once each day, but repeat the steps more times for each exercise (do more repetitions). If you do not have pain in your back, do these exercises once each day or as told by your doctor. Exercises Single Knee to Chest  Do these steps 3-5 times in a row for each leg: 1. Lie on your back on a firm bed or the floor with your legs stretched out. 2. Bring one knee to your chest. 3. Hold your knee to your chest by grabbing your knee or thigh. 4. Pull on your knee until you feel a gentle stretch in your lower back. 5. Keep doing the stretch for 10-30 seconds. 6. Slowly let go of your leg and straighten it.  Pelvic Tilt  Do these steps 5-10 times in a row: 1. Lie on your back on a firm bed or the floor with your legs stretched out. 2. Bend your knees so they point up to the ceiling. Your feet should be flat on the floor. 3. Tighten your lower belly (abdomen) muscles to press your lower back against the floor. This will make your tailbone point up to the ceiling instead of pointing down to your feet or the floor. 4. Stay in this position for 5-10 seconds while you gently tighten your muscles and breathe evenly.  Cat-Cow  Do these steps until your lower back bends more easily: 1. Get on your hands and knees on a firm surface. Keep your hands under your shoulders, and keep your knees under your hips. You may put padding under your knees. 2. Let your head hang down, and make your tailbone point down to the floor so your lower back is round like the back of a cat. 3. Stay in this position for 5 seconds. 4. Slowly lift your head and make your tailbone point up to the ceiling so your back hangs low (sags) like the back of a cow. 5. Stay in this position for 5 seconds.  Press-Ups  Do these steps 5-10 times in a row: 1. Lie on your belly (face-down)  on the floor. 2. Place your hands near your head, about shoulder-width apart. 3. While you keep your back relaxed and keep your hips on the floor, slowly straighten your arms to raise the top half of your body and lift your shoulders. Do not use your back muscles. To make yourself more comfortable, you may change where you place your hands. 4. Stay in this position for 5 seconds. 5. Slowly return to lying flat on the floor.  Bridges  Do these steps 10 times in a row: 1. Lie on your back on a firm surface. 2. Bend your knees so they point up to the ceiling. Your feet should be flat on the floor. 3. Tighten your butt muscles and lift your butt off of the floor until your waist is almost as high as your knees. If you do not feel the muscles working in your butt and the back of your thighs, slide your feet 1-2 inches farther away from your butt. 4. Stay in this position for 3-5 seconds. 5. Slowly lower your butt to the floor, and let your butt muscles relax.  If this exercise is too easy, try doing it with your arms crossed over your chest. Belly Crunches  Do these steps 5-10 times in   a row: 1. Lie on your back on a firm bed or the floor with your legs stretched out. 2. Bend your knees so they point up to the ceiling. Your feet should be flat on the floor. 3. Cross your arms over your chest. 4. Tip your chin a little bit toward your chest but do not bend your neck. 5. Tighten your belly muscles and slowly raise your chest just enough to lift your shoulder blades a tiny bit off of the floor. 6. Slowly lower your chest and your head to the floor.  Back Lifts Do these steps 5-10 times in a row: 1. Lie on your belly (face-down) with your arms at your sides, and rest your forehead on the floor. 2. Tighten the muscles in your legs and your butt. 3. Slowly lift your chest off of the floor while you keep your hips on the floor. Keep the back of your head in line with the curve in your back. Look at  the floor while you do this. 4. Stay in this position for 3-5 seconds. 5. Slowly lower your chest and your face to the floor.  Contact a doctor if:  Your back pain gets a lot worse when you do an exercise.  Your back pain does not lessen 2 hours after you exercise. If you have any of these problems, stop doing the exercises. Do not do them again unless your doctor says it is okay. Get help right away if:  You have sudden, very bad back pain. If this happens, stop doing the exercises. Do not do them again unless your doctor says it is okay. This information is not intended to replace advice given to you by your health care provider. Make sure you discuss any questions you have with your health care provider. Document Released: 08/07/2010 Document Revised: 12/11/2015 Document Reviewed: 08/29/2014 Elsevier Interactive Patient Education  2018 [place] Injury Prevention Back injuries can be very painful. They can also be difficult to heal. After having one back injury, you are more likely to injure your back again. It is important to learn how to avoid injuring or re-injuring your back. The following tips can help you to prevent a back injury. What should I know about physical fitness?  Exercise for 30 minutes per day on most days of the week or as told by your doctor. Make sure to: ? Do aerobic exercises, such as walking, jogging, biking, or swimming. ? Do exercises that increase balance and strength, such as tai chi and yoga. ? Do stretching exercises. This helps with flexibility. ? Try to develop strong belly (abdominal) muscles. Your belly muscles help to support your back.  Stay at a healthy weight. This helps to decrease your risk of a back injury. What should I know about my diet?  Talk with your doctor about your overall diet. Take supplements and vitamins only as told by your doctor.  Talk with your doctor about how much calcium and vitamin D you need each day. These  nutrients help to prevent weakening of the bones (osteoporosis).  Include good sources of calcium in your diet, such as: ? Dairy products. ? Green leafy vegetables. ? Products that have had calcium added to them (fortified).  Include good sources of vitamin D in your diet, such as: ? Milk. ? Foods that have had vitamin D added to them. What should I know about my posture?  Sit up straight and stand up straight. Avoid leaning forward when you  sit or hunching over when you stand.  Choose chairs that have good low-back (lumbar) support.  If you work at a desk, sit close to it so you do not need to lean over. Keep your chin tucked in. Keep your neck drawn back. Keep your elbows bent so your arms look like the letter "L" (right angle).  Sit high and close to the steering wheel when you drive. Add a low-back support to your car seat, if needed.  Avoid sitting or standing in one position for very long. Take breaks to get up, stretch, and walk around at least one time every hour. Take breaks every hour if you are driving for long periods of time.  Sleep on your side with your knees slightly bent, or sleep on your back with a pillow under your knees. Do not lie on the front of your body to sleep. What should I know about lifting, twisting, and reaching? Lifting and Heavy Lifting   Avoid heavy lifting, especially lifting over and over again. If you must do heavy lifting: ? Stretch before lifting. ? Work slowly. ? Rest between lifts. ? Use a tool such as a cart or a dolly to move objects if one is available. ? Make several small trips instead of carrying one heavy load. ? Ask for help when you need it, especially when moving big objects.  Follow these steps when lifting: ? Stand with your feet shoulder-width apart. ? Get as close to the object as you can. Do not pick up a heavy object that is far from your body. ? Use handles or lifting straps if they are available. ? Bend at your knees.  Squat down, but keep your heels off the floor. ? Keep your shoulders back. Keep your chin tucked in. Keep your back straight. ? Lift the object slowly while you tighten the muscles in your legs, belly, and butt. Keep the object as close to the center of your body as possible.  Follow these steps when putting down a heavy load: ? Stand with your feet shoulder-width apart. ? Lower the object slowly while you tighten the muscles in your legs, belly, and butt. Keep the object as close to the center of your body as possible. ? Keep your shoulders back. Keep your chin tucked in. Keep your back straight. ? Bend at your knees. Squat down, but keep your heels off the floor. ? Use handles or lifting straps if they are available. Twisting and Reaching  Avoid lifting heavy objects above your waist.  Do not twist at your waist while you are lifting or carrying a load. If you need to turn, move your feet.  Do not bend over without bending at your knees.  Avoid reaching over your head, across a table, or for an object on a high surface. What are some other tips?  Avoid wet floors and icy ground. Keep sidewalks clear of ice to prevent falls.  Do not sleep on a mattress that is too soft or too hard.  Keep items that you use often within easy reach.  Put heavier objects on shelves at waist level, and put lighter objects on lower or higher shelves.  Find ways to lower your stress, such as: ? Exercise. ? Massage. ? Relaxation techniques.  Talk with your doctor if you feel anxious or depressed. These conditions can make back pain worse.  Wear flat heel shoes with cushioned soles.  Avoid making quick (sudden) movements.  Use both shoulder straps when  carrying a backpack.  Do not use any tobacco products, including cigarettes, chewing tobacco, or electronic cigarettes. If you need help quitting, ask your doctor. This information is not intended to replace advice given to you by your health care  provider. Make sure you discuss any questions you have with your health care provider. Document Released: 12/22/2007 Document Revised: 12/11/2015 Document Reviewed: 07/09/2014 Elsevier Interactive Patient Education  2018 [place].  Back Pain, Adult Back pain is very common. The pain often gets better over time. The cause of back pain is usually not dangerous. Most people can learn to manage their back pain on their own. Follow these instructions at home: Watch your back pain for any changes. The following actions may help to lessen any pain you are feeling:  Stay active. Start with short walks on flat ground if you can. Try to walk farther each day.  Exercise regularly as told by your doctor. Exercise helps your back heal faster. It also helps avoid future injury by keeping your muscles strong and flexible.  Do not sit, drive, or stand in one place for more than 30 minutes.  Do not stay in bed. Resting more than 1-2 days can slow down your recovery.  Be careful when you bend or lift an object. Use good form when lifting: ? Bend at your knees. ? Keep the object close to your body. ? Do not twist.  Sleep on a firm mattress. Lie on your side, and bend your knees. If you lie on your back, put a pillow under your knees.  Take medicines only as told by your doctor.  Put ice on the injured area. ? Put ice in a plastic bag. ? Place a towel between your skin and the bag. ? Leave the ice on for 20 minutes, 2-3 times a day for the first 2-3 days. After that, you can switch between ice and heat packs.  Avoid feeling anxious or stressed. Find good ways to deal with stress, such as exercise.  Maintain a healthy weight. Extra weight puts stress on your back.  Contact a doctor if:  You have pain that does not go away with rest or medicine.  You have worsening pain that goes down into your legs or buttocks.  You have pain that does not get better in one week.  You have pain at  night.  You lose weight.  You have a fever or chills. Get help right away if:  You cannot control when you poop (bowel movement) or pee (urinate).  Your arms or legs feel weak.  Your arms or legs lose feeling (numbness).  You feel sick to your stomach (nauseous) or throw up (vomit).  You have belly (abdominal) pain.  You feel like you may pass out (faint). This information is not intended to replace advice given to you by your health care provider. Make sure you discuss any questions you have with your health care provider. Document Released: 12/22/2007 Document Revised: 12/11/2015 Document Reviewed: 11/06/2013 Elsevier Interactive Patient Education  [employer].
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
## 2017-08-29 MED FILL — BENAZEPRIL HCL 40 MG TABLET: 40 | 30 days supply | Qty: 30 | Fill #1

## 2017-09-05 ENCOUNTER — Ambulatory Visit: Payer: Self-pay

## 2017-09-09 ENCOUNTER — Ambulatory Visit: Payer: Self-pay | Admitting: Family Medicine

## 2017-09-09 DIAGNOSIS — Z0289 Encounter for other administrative examinations: Secondary | ICD-10-CM

## 2017-09-20 MED FILL — traMADol HCL 50 MG TABS: 50 | 30 days supply | Qty: 60 | Fill #1

## 2017-12-15 ENCOUNTER — Ambulatory Visit (INDEPENDENT_AMBULATORY_CARE_PROVIDER_SITE_OTHER): Payer: Self-pay | Admitting: Family Medicine

## 2017-12-15 ENCOUNTER — Encounter: Payer: Self-pay | Admitting: Family Medicine

## 2017-12-15 VITALS — BP 178/118 | HR 72 | Temp 98.4°F | Ht 63.6 in | Wt 230.4 lb

## 2017-12-15 DIAGNOSIS — I10 Essential (primary) hypertension: Secondary | ICD-10-CM

## 2017-12-15 DIAGNOSIS — M79671 Pain in right foot: Secondary | ICD-10-CM

## 2017-12-15 DIAGNOSIS — R635 Abnormal weight gain: Secondary | ICD-10-CM

## 2017-12-15 MED ORDER — AMLODIPINE BESYLATE 10 MG PO TABS: 10.0000 mg | ORAL_TABLET | Freq: Every day | ORAL | 1 refills | Status: DC

## 2017-12-15 MED ORDER — BENAZEPRIL HCL 40 MG PO TABS: 40.0000 mg | ORAL_TABLET | Freq: Every day | ORAL | 1 refills | Status: DC

## 2017-12-15 MED ORDER — TRAMADOL HCL 50 MG PO TABS: 50.0000 mg | ORAL_TABLET | Freq: Two times a day (BID) | ORAL | 1 refills | Status: DC | PRN

## 2017-12-15 MED FILL — traMADol HCL 50 MG TABS: 50 | 30 days supply | Qty: 60 | Fill #0

## 2017-12-15 MED FILL — BENAZEPRIL HCL 40 MG TABLET: 40 | 90 days supply | Qty: 90 | Fill #0

## 2017-12-15 MED FILL — AMLODIPINE BESYLATE 10 MG T: 10 | 90 days supply | Qty: 90 | Fill #0

## 2017-12-15 NOTE — Addendum Note (Signed)
Addended by: Grier Mitts R on: 12/15/2017 09:17 AM   Modules accepted: Orders
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
## 2017-12-15 NOTE — Patient Instructions (Signed)
DASH Eating Plan DASH stands for "Dietary Approaches to Stop Hypertension." The DASH eating plan is a healthy eating plan that has been shown to reduce high blood pressure (hypertension). It may also reduce your risk for type 2 diabetes, heart disease, and stroke. The DASH eating plan may also help with weight loss. What are tips for following this plan? General guidelines  Avoid eating more than 2,300 mg (milligrams) of salt (sodium) a day. If you have hypertension, you may need to reduce your sodium intake to 1,500 mg a day.  Limit alcohol intake to no more than 1 drink a day for nonpregnant women and 2 drinks a day for men. One drink equals 12 oz of beer, 5 oz of wine, or 1 oz of hard liquor.  Work with your health care provider to maintain a healthy body weight or to lose weight. Ask what an ideal weight is for you.  Get at least 30 minutes of exercise that causes your heart to beat faster (aerobic exercise) most days of the week. Activities may include walking, swimming, or biking.  Work with your health care provider or diet and nutrition specialist (dietitian) to adjust your eating plan to your individual calorie needs. Reading food labels  Check food labels for the amount of sodium per serving. Choose foods with less than 5 percent of the Daily Value of sodium. Generally, foods with less than 300 mg of sodium per serving fit into this eating plan.  To find whole grains, look for the word "whole" as the first word in the ingredient list. Shopping  Buy products labeled as "low-sodium" or "no salt added."  Buy fresh foods. Avoid canned foods and premade or frozen meals. Cooking  Avoid adding salt when cooking. Use salt-free seasonings or herbs instead of table salt or sea salt. Check with your health care provider or pharmacist before using salt substitutes.  Do not fry foods. Cook foods using healthy methods such as baking, boiling, grilling, and broiling instead.  Cook with  heart-healthy oils, such as olive, canola, soybean, or sunflower oil. Meal planning   Eat a balanced diet that includes: ? 5 or more servings of fruits and vegetables each day. At each meal, try to fill half of your plate with fruits and vegetables. ? Up to 6-8 servings of whole grains each day. ? Less than 6 oz of lean meat, poultry, or fish each day. A 3-oz serving of meat is about the same size as a deck of cards. One egg equals 1 oz. ? 2 servings of low-fat dairy each day. ? A serving of nuts, seeds, or beans 5 times each week. ? Heart-healthy fats. Healthy fats called Omega-3 fatty acids are found in foods such as flaxseeds and coldwater fish, like sardines, salmon, and mackerel.  Limit how much you eat of the following: ? Canned or prepackaged foods. ? Food that is high in trans fat, such as fried foods. ? Food that is high in saturated fat, such as fatty meat. ? Sweets, desserts, sugary drinks, and other foods with added sugar. ? Full-fat dairy products.  Do not salt foods before eating.  Try to eat at least 2 vegetarian meals each week.  Eat more home-cooked food and less restaurant, buffet, and fast food.  When eating at a restaurant, ask that your food be prepared with less salt or no salt, if possible. What foods are recommended? The items listed may not be a complete list. Talk with your dietitian about what   dietary choices are best for you. Grains Whole-grain or whole-wheat bread. Whole-grain or whole-wheat pasta. Brown rice. Oatmeal. Quinoa. Bulgur. Whole-grain and low-sodium cereals. Pita bread. Low-fat, low-sodium crackers. Whole-wheat flour tortillas. Vegetables Fresh or frozen vegetables (raw, steamed, roasted, or grilled). Low-sodium or reduced-sodium tomato and vegetable juice. Low-sodium or reduced-sodium tomato sauce and tomato paste. Low-sodium or reduced-sodium canned vegetables. Fruits All fresh, dried, or frozen fruit. Canned fruit in natural juice (without  added sugar). Meat and other protein foods Skinless chicken or turkey. Ground chicken or turkey. Pork with fat trimmed off. Fish and seafood. Egg whites. Dried beans, peas, or lentils. Unsalted nuts, nut butters, and seeds. Unsalted canned beans. Lean cuts of beef with fat trimmed off. Low-sodium, lean deli meat. Dairy Low-fat (1%) or fat-free (skim) milk. Fat-free, low-fat, or reduced-fat cheeses. Nonfat, low-sodium ricotta or cottage cheese. Low-fat or nonfat yogurt. Low-fat, low-sodium cheese. Fats and oils Soft margarine without trans fats. Vegetable oil. Low-fat, reduced-fat, or light mayonnaise and salad dressings (reduced-sodium). Canola, safflower, olive, soybean, and sunflower oils. Avocado. Seasoning and other foods Herbs. Spices. Seasoning mixes without salt. Unsalted popcorn and pretzels. Fat-free sweets. What foods are not recommended? The items listed may not be a complete list. Talk with your dietitian about what dietary choices are best for you. Grains Baked goods made with fat, such as croissants, muffins, or some breads. Dry pasta or rice meal packs. Vegetables Creamed or fried vegetables. Vegetables in a cheese sauce. Regular canned vegetables (not low-sodium or reduced-sodium). Regular canned tomato sauce and paste (not low-sodium or reduced-sodium). Regular tomato and vegetable juice (not low-sodium or reduced-sodium). Pickles. Olives. Fruits Canned fruit in a light or heavy syrup. Fried fruit. Fruit in cream or butter sauce. Meat and other protein foods Fatty cuts of meat. Ribs. Fried meat. Bacon. Sausage. Bologna and other processed lunch meats. Salami. Fatback. Hotdogs. Bratwurst. Salted nuts and seeds. Canned beans with added salt. Canned or smoked fish. Whole eggs or egg yolks. Chicken or turkey with skin. Dairy Whole or 2% milk, cream, and half-and-half. Whole or full-fat cream cheese. Whole-fat or sweetened yogurt. Full-fat cheese. Nondairy creamers. Whipped toppings.  Processed cheese and cheese spreads. Fats and oils Butter. Stick margarine. Lard. Shortening. Ghee. Bacon fat. Tropical oils, such as coconut, palm kernel, or palm oil. Seasoning and other foods Salted popcorn and pretzels. Onion salt, garlic salt, seasoned salt, table salt, and sea salt. Worcestershire sauce. Tartar sauce. Barbecue sauce. Teriyaki sauce. Soy sauce, including reduced-sodium. Steak sauce. Canned and packaged gravies. Fish sauce. Oyster sauce. Cocktail sauce. Horseradish that you find on the shelf. Ketchup. Mustard. Meat flavorings and tenderizers. Bouillon cubes. Hot sauce and Tabasco sauce. Premade or packaged marinades. Premade or packaged taco seasonings. Relishes. Regular salad dressings. Where to find more information:  National Heart, Lung, and Blood Institute: www.nhlbi.nih.gov  American Heart Association: www.heart.org Summary  The DASH eating plan is a healthy eating plan that has been shown to reduce high blood pressure (hypertension). It may also reduce your risk for type 2 diabetes, heart disease, and stroke.  With the DASH eating plan, you should limit salt (sodium) intake to 2,300 mg a day. If you have hypertension, you may need to reduce your sodium intake to 1,500 mg a day.  When on the DASH eating plan, aim to eat more fresh fruits and vegetables, whole grains, lean proteins, low-fat dairy, and heart-healthy fats.  Work with your health care provider or diet and nutrition specialist (dietitian) to adjust your eating plan to your individual   calorie needs. This information is not intended to replace advice given to you by your health care provider. Make sure you discuss any questions you have with your health care provider. Document Released: 06/24/2011 Document Revised: 06/28/2016 Document Reviewed: 06/28/2016 Elsevier Interactive Patient Education  2018 [employer]  

## 2018-01-05 ENCOUNTER — Ambulatory Visit: Payer: Self-pay | Admitting: Registered"

## 2018-01-24 MED FILL — traMADol HCL 50 MG TABS: 50 | 30 days supply | Qty: 60 | Fill #1

## 2018-01-26 ENCOUNTER — Ambulatory Visit (INDEPENDENT_AMBULATORY_CARE_PROVIDER_SITE_OTHER): Payer: Self-pay | Admitting: Family Medicine

## 2018-01-26 ENCOUNTER — Encounter: Payer: Self-pay | Admitting: Family Medicine

## 2018-01-26 VITALS — BP 120/90 | HR 87 | Temp 98.5°F | Wt 224.0 lb

## 2018-01-26 DIAGNOSIS — R300 Dysuria: Secondary | ICD-10-CM

## 2018-01-26 LAB — POCT URINALYSIS DIPSTICK
BILIRUBIN UA: NEGATIVE
GLUCOSE UA: NEGATIVE
KETONES UA: NEGATIVE
Nitrite, UA: NEGATIVE
Protein, UA: NEGATIVE
Spec Grav, UA: >=1.03 — AB (ref 1.010–1.025)
Urobilinogen, UA: 0.2 E.U./dL
pH, UA: 5 (ref 5.0–8.0)

## 2018-01-26 MED ORDER — NITROFURANTOIN MONOHYD MACRO 100 MG PO CAPS: 100.0000 mg | ORAL_CAPSULE | Freq: Two times a day (BID) | ORAL | 0 refills | Status: DC

## 2018-01-26 MED FILL — NITROFURANTOIN MONO-MCR 100: 100 | 5 days supply | Qty: 10 | Fill #0

## 2018-01-26 NOTE — Progress Notes (Signed)
  HPI:  Using dictation device. Unfortunately this device frequently misinterprets words/phrases.  Acute visit for Dysuria: -intermittent x 2 weeks -fdlmp last week (7/3) -symptoms: urinary odor, mild frequency/urgency -denies: fevers, malaise, nausea, vomiting, pelv/abd/flank pain, concerns for STI or vaginal infection -did have sex a few weeks ago - always uses condoms  ROS: See pertinent positives and negatives per HPI.  Past Medical History:  Diagnosis Date  . Hypertension     Past Surgical History:  Procedure Laterality Date  . FOOT SURGERY Right     History reviewed. No pertinent family history.  SOCIAL HX: see hpi   Current Outpatient Medications:  .  amLODipine (NORVASC) 10 MG tablet, Take 1 tablet (10 mg total) by mouth daily., Disp: 90 tablet, Rfl: 1 .  benazepril (LOTENSIN) 40 MG tablet, Take 1 tablet (40 mg total) by mouth daily., Disp: 90 tablet, Rfl: 1 .  cyclobenzaprine (FLEXERIL) 5 MG tablet, Take 1 tablet (5 mg total) by mouth 3 (three) times daily as needed for muscle spasms., Disp: 30 tablet, Rfl: 0 .  ferrous sulfate 325 (65 FE) MG tablet, Take 1 tablet (325 mg total) by mouth daily with breakfast., Disp: 90 tablet, Rfl: 2 .  fluticasone (FLONASE) 50 MCG/ACT nasal spray, Place 1 spray into both nostrils daily., Disp: 16 g, Rfl: 0 .  traMADol (ULTRAM) 50 MG tablet, Take 1 tablet (50 mg total) by mouth every 12 (twelve) hours as needed for moderate pain., Disp: 60 tablet, Rfl: 1 .  nitrofurantoin, macrocrystal-monohydrate, (MACROBID) 100 MG capsule, Take 1 capsule (100 mg total) by mouth 2 (two) times daily., Disp: 10 capsule, Rfl: 0  EXAM:  Vitals:   01/26/18 0901  BP: 120/90  Pulse: 87  Temp: 98.5 F (36.9 C)    Body mass index is 38.93 kg/m.  GENERAL: vitals reviewed and listed above, alert, oriented, appears well hydrated and in no acute distress  HEENT: atraumatic, conjunttiva clear, no obvious abnormalities on inspection of external nose and  ears  NECK: no obvious masses on inspection  LUNGS: clear to auscultation bilaterally, no wheezes, rales or rhonchi, good air movement  CV: HRRR, no peripheral edema  ABD: soft, NTTP, no CVA TTP  MS: moves all extremities without noticeable abnormality  PSYCH: pleasant and cooperative, no obvious depression or anxiety  ASSESSMENT AND PLAN:  Discussed the following assessment and plan:  Dysuria - Plan: POC Urinalysis Dipstick, Culture, Urine  -udip + culture, would expect blood given just finishing menstrual cycle -treat accordingly if infection. Addendum: +leuks and blood, opted for empiric tx for UTI pending culture. Macrobid rx sent. -o/w if urine neg and symptoms persist advised f/u with PCP for pelvic/testing for vaginitis and further eval - pt agreeable to plan -Patient advised to return or notify a doctor immediately if symptoms worsen or persist or new concerns arise.  Patient Instructions  BEFORE YOU LEAVE: -udip + culture  I hope you are feeling better soon! Seek care promptly if your symptoms worsen, new concerns arise or you are not improving over the next 1 week.      Lucretia Kern, DO

## 2018-01-26 NOTE — Patient Instructions (Signed)
BEFORE YOU LEAVE: -udip + culture  I hope you are feeling better soon! Seek care promptly if your symptoms worsen, new concerns arise or you are not improving over the next 1 week.

## 2018-01-27 LAB — URINE CULTURE
MICRO NUMBER:: 90822891
Result:: NO GROWTH
SPECIMEN QUALITY:: ADEQUATE

## 2018-02-07 ENCOUNTER — Telehealth: Payer: Self-pay | Admitting: Family Medicine

## 2018-02-07 NOTE — Telephone Encounter (Signed)
Copied from [place] [id number]. Topic: General - Other >> Feb 07, 2018  9:05 AM Mia Carlson wrote: Reason for CRM: pt states that the Microbid didn't help her at all she still has the same symptoms discharge frequent urination and odor  without pain

## 2018-02-08 NOTE — Telephone Encounter (Signed)
No UTI per [facility].  Pt should be seen in office if still having issues.

## 2018-02-10 NOTE — Telephone Encounter (Signed)
Called pt left a detailed message for pt to call the office and schedule an appointment if still has symptoms.

## 2018-02-13 ENCOUNTER — Ambulatory Visit (INDEPENDENT_AMBULATORY_CARE_PROVIDER_SITE_OTHER): Payer: Self-pay | Admitting: Internal Medicine

## 2018-02-13 ENCOUNTER — Encounter: Payer: Self-pay | Admitting: Internal Medicine

## 2018-02-13 VITALS — BP 130/80 | HR 73 | Temp 98.9°F | Wt 225.4 lb

## 2018-02-13 DIAGNOSIS — B3731 Acute candidiasis of vulva and vagina: Secondary | ICD-10-CM

## 2018-02-13 DIAGNOSIS — B373 Candidiasis of vulva and vagina: Secondary | ICD-10-CM

## 2018-02-13 MED ORDER — FLUCONAZOLE 150 MG PO TABS: 150.0000 mg | ORAL_TABLET | Freq: Once | ORAL | 0 refills | Status: AC

## 2018-02-13 MED FILL — FLUCONAZOLE 150 MG TABS: 150 | 12 days supply | Qty: 4 | Fill #0

## 2018-02-13 NOTE — Progress Notes (Signed)
Subjective:    Patient ID: Mia Carlson, female    DOB: Apr 28, 1971, 47 y.o.   MRN: [id number]  HPI  47 year old patient who was seen earlier this month and treated for suspected UTI with Macrodantin.  Subsequent UTI was negative.  However the patient did complete antibiotic therapy.  She states that she has had a vaginal discharge that has intensified.  She states that she has had frequent yeast infections associated with antibiotic use in the past  She has essential hypertension controlled on medication  Past Medical History:  Diagnosis Date  . Hypertension      Social History   Socioeconomic History  . Marital status: Divorced    Spouse name: Not on file  . Number of children: Not on file  . Years of education: Not on file  . Highest education level: Not on file  Occupational History  . Not on file  Social Needs  . Financial resource strain: Not on file  . Food insecurity:    Worry: Not on file    Inability: Not on file  . Transportation needs:    Medical: Not on file    Non-medical: Not on file  Tobacco Use  . Smoking status: Former Research scientist (life sciences)  . Smokeless tobacco: Never Used  Substance and Sexual Activity  . Alcohol use: No  . Drug use: No  . Sexual activity: Not on file  Lifestyle  . Physical activity:    Days per week: Not on file    Minutes per session: Not on file  . Stress: Not on file  Relationships  . Social connections:    Talks on phone: Not on file    Gets together: Not on file    Attends religious service: Not on file    Active member of club or organization: Not on file    Attends meetings of clubs or organizations: Not on file    Relationship status: Not on file  . Intimate partner violence:    Fear of current or ex partner: Not on file    Emotionally abused: Not on file    Physically abused: Not on file    Forced sexual activity: Not on file  Other Topics Concern  . Not on file  Social History Narrative  . Not on file    Past  Surgical History:  Procedure Laterality Date  . FOOT SURGERY Right     No family history on file.  No Known Allergies  Current Outpatient Medications on File Prior to Visit  Medication Sig Dispense Refill  . amLODipine (NORVASC) 10 MG tablet Take 1 tablet (10 mg total) by mouth daily. 90 tablet 1  . benazepril (LOTENSIN) 40 MG tablet Take 1 tablet (40 mg total) by mouth daily. 90 tablet 1  . cyclobenzaprine (FLEXERIL) 5 MG tablet Take 1 tablet (5 mg total) by mouth 3 (three) times daily as needed for muscle spasms. 30 tablet 0  . ferrous sulfate 325 (65 FE) MG tablet Take 1 tablet (325 mg total) by mouth daily with breakfast. 90 tablet 2  . fluticasone (FLONASE) 50 MCG/ACT nasal spray Place 1 spray into both nostrils daily. 16 g 0  . nitrofurantoin, macrocrystal-monohydrate, (MACROBID) 100 MG capsule Take 1 capsule (100 mg total) by mouth 2 (two) times daily. 10 capsule 0  . traMADol (ULTRAM) 50 MG tablet Take 1 tablet (50 mg total) by mouth every 12 (twelve) hours as needed for moderate pain. 60 tablet 1   No current facility-administered medications  on file prior to visit.     BP 130/80 (BP Location: Right Wrist, Patient Position: Sitting, Cuff Size: Normal)   Pulse 73   Temp 98.9 F (37.2 C) (Oral)   Wt 225 lb 6.4 oz (102.2 kg)   LMP 01/18/2018 (Exact Date)   SpO2 99%   BMI 39.18 kg/m      Review of Systems  Constitutional: Negative.   HENT: Negative for congestion, dental problem, hearing loss, rhinorrhea, sinus pressure, sore throat and tinnitus.   Eyes: Negative for pain, discharge and visual disturbance.  Respiratory: Negative for cough and shortness of breath.   Cardiovascular: Negative for chest pain, palpitations and leg swelling.  Gastrointestinal: Negative for abdominal distention, abdominal pain, blood in stool, constipation, diarrhea, nausea and vomiting.  Genitourinary: Positive for dysuria, genital sores and vaginal discharge. Negative for difficulty  urinating, flank pain, frequency, hematuria, pelvic pain, urgency, vaginal bleeding and vaginal pain.  Musculoskeletal: Negative for arthralgias, gait problem and joint swelling.  Skin: Negative for rash.  Neurological: Negative for dizziness, syncope, speech difficulty, weakness, numbness and headaches.  Hematological: Negative for adenopathy.  Psychiatric/Behavioral: Negative for agitation, behavioral problems and dysphoric mood. The patient is not nervous/anxious.        Objective:   Physical Exam  Constitutional: She appears well-developed and well-nourished. No distress.  Weight 225 Blood pressure well controlled  Abdominal: Soft. She exhibits no distension. There is no tenderness.          Assessment & Plan:   Monilial vaginitis.  The patient was treated with fluconazole 150 mg.  Will take a second dose in 7 days if vaginal discharge persists.  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  The patient was given a prescription for #4 fluconazole.  It was suggested that she take fluconazole in the future at the start and completion of future antibiotic use Essential hypertension well-controlled  Marletta Lor

## 2018-02-13 NOTE — Patient Instructions (Addendum)
Fluconazole 150 mg one-time dose.  If vaginal discharge persist repeat in 1 week  Due to your frequent yeast infections in the setting of antibiotic use, in the future consider taking Diflucan at the start of antibiotic therapy and after completion.   Vaginal Yeast infection, Adult Vaginal yeast infection is a condition that causes soreness, swelling, and redness (inflammation) of the vagina. It also causes vaginal discharge. This is a common condition. Some women get this infection frequently. What are the causes? This condition is caused by a change in the normal balance of the yeast (candida) and bacteria that live in the vagina. This change causes an overgrowth of yeast, which causes the inflammation. What increases the risk? This condition is more likely to develop in:  Women who take antibiotic medicines.  Women who have diabetes.  Women who take birth control pills.  Women who are pregnant.  Women who douche often.  Women who have a weak defense (immune) system.  Women who have been taking steroid medicines for a long time.  Women who frequently wear tight clothing.  What are the signs or symptoms? Symptoms of this condition include:  White, thick vaginal discharge.  Swelling, itching, redness, and irritation of the vagina. The lips of the vagina (vulva) may be affected as well.  Pain or a burning feeling while urinating.  Pain during sex.  How is this diagnosed? This condition is diagnosed with a medical history and physical exam. This will include a pelvic exam. Your health care provider will examine a sample of your vaginal discharge under a microscope. Your health care provider may send this sample for testing to confirm the diagnosis. How is this treated? This condition is treated with medicine. Medicines may be over-the-counter or prescription. You may be told to use one or more of the following:  Medicine that is taken orally.  Medicine that is applied as a  cream.  Medicine that is inserted directly into the vagina (suppository).  Follow these instructions at home:  Take or apply over-the-counter and prescription medicines only as told by your health care provider.  Do not have sex until your health care provider has approved. Tell your sex partner that you have a yeast infection. That person should go to his or her health care provider if he or she develops symptoms.  Do not wear tight clothes, such as pantyhose or tight pants.  Avoid using tampons until your health care provider approves.  Eat more yogurt. This may help to keep your yeast infection from returning.  Try taking a sitz bath to help with discomfort. This is a warm water bath that is taken while you are sitting down. The water should only come up to your hips and should cover your buttocks. Do this 3-4 times per day or as told by your health care provider.  Do not douche.  Wear breathable, cotton underwear.  If you have diabetes, keep your blood sugar levels under control. Contact a health care provider if:  You have a fever.  Your symptoms go away and then return.  Your symptoms do not get better with treatment.  Your symptoms get worse.  You have new symptoms.  You develop blisters in or around your vagina.  You have blood coming from your vagina and it is not your menstrual period.  You develop pain in your abdomen. This information is not intended to replace advice given to you by your health care provider. Make sure you discuss any questions you  have with your health care provider. Document Released: 04/14/2005 Document Revised: 12/17/2015 Document Reviewed: 01/06/2015 Elsevier Interactive Patient Education  2018 [employer].

## 2018-02-15 ENCOUNTER — Ambulatory Visit: Payer: Self-pay | Admitting: *Deleted

## 2018-02-15 NOTE — Telephone Encounter (Signed)
Pt calling stating she has experienced some vaginal bleeding since Tuesday.Pt concerened that the bleeding may be related to taking Diflucan on Monday. Pt states that the bleeding is light red with small clots. Pt states she has been able to wear a panty liner but today she had to put on a pad. Pt states that her last menstrual cycle was in the middle of this month and states that her cycles are irregular and can come on at any time. Pt denies any dizziness or pain at this time. Pt has appt previously scheduled with Dr. Volanda Napoleon on 02/23/18. Pt advised that if symptoms become worse before scheduled appt to return call to the office. Pt verbalized understanding Reason for Disposition . [1] Menstrual cycle < 21 days OR > 35 days AND [2] has occurred once this past year  Answer Assessment - Initial Assessment Questions 1. AMOUNT: "Describe the bleeding that you are having."    - SPOTTING: spotting, or pinkish / brownish mucous discharge; does not fill panti-liner or pad    - MILD:  less than 1 pad / hour; less than patient's usual menstrual bleeding   - MODERATE: 1-2 pads / hour; 1 menstrual cup every 6 hours; small-medium blood clots (e.g., pea, grape, small coin)   - SEVERE: soaking 2 or more pads/hour for 2 or more hours; 1 menstrual cup every 2 hours; bleeding not contained by pads or continuous red blood from vagina; large blood clots (e.g., golf ball, large coin)      Mild, pt states she could wear a panty liner all day if she did not change it but states today she had to wear a pad due to soiling her clothes. Pt states that the bleeding is light red in color and does have small clots 2. ONSET: "When did the bleeding begin?" "Is it continuing now?"     Tuesday and yes it is continuing 3. MENSTRUAL PERIOD: "When was the last normal menstrual period?" "How is this different than your period?"     Last cycle the middle of this month 4. REGULARITY: "How regular are your periods?"     Periods are  irregular 5. ABDOMINAL PAIN: "Do you have any pain?" "How bad is the pain?"  (e.g., Scale 1-10; mild, moderate, or severe)   - MILD (1-3): doesn't interfere with normal activities, abdomen soft and not tender to touch    - MODERATE (4-7): interferes with normal activities or awakens from sleep, tender to touch    - SEVERE (8-10): excruciating pain, doubled over, unable to do any normal activities      No 6. PREGNANCY: "Could you be pregnant?" "Are you sexually active?" "Did you recently give birth?"     No, pt had tubal ligation 7. BREASTFEEDING: "Are you breastfeeding?"     no 8. HORMONES: "Are you taking any hormone medications, prescription or OTC?" (e.g., birth control pills, estrogen)     No  Bithcontrol, had tubal ligation 9. BLOOD THINNERS: "Do you take any blood thinners?" (e.g., Coumadin/warfarin, Pradaxa/dabigatran, aspirin)     No 10. CAUSE: "What do you think is causing the bleeding?" (e.g., recent gyn surgery, recent gyn procedure; known bleeding disorder, cervical cancer, polycystic ovarian disease, fibroids)         Unsure recently was treated for yeast infection 11. HEMODYNAMIC STATUS: "Are you weak or feeling lightheaded?" If so, ask: "Can you stand and walk normally?"        No 12. OTHER SYMPTOMS: "What other symptoms are you  having with the bleeding?" (e.g., passed tissue, vaginal discharge, fever, menstrual-type cramps)       No  Protocols used: VAGINAL BLEEDING - ABNORMAL-A-AH

## 2018-02-23 ENCOUNTER — Ambulatory Visit (INDEPENDENT_AMBULATORY_CARE_PROVIDER_SITE_OTHER): Payer: Self-pay | Admitting: Family Medicine

## 2018-02-23 ENCOUNTER — Other Ambulatory Visit (HOSPITAL_COMMUNITY)
Admission: RE | Admit: 2018-02-23 | Discharge: 2018-02-23 | Disposition: A | Discharge status: Home / Self Care | Payer: Self-pay | Source: Ambulatory Visit | Attending: Family Medicine | Admitting: Family Medicine

## 2018-02-23 ENCOUNTER — Encounter: Payer: Self-pay | Admitting: Family Medicine

## 2018-02-23 VITALS — BP 142/70 | HR 87 | Temp 98.3°F | Wt 222.5 lb

## 2018-02-23 DIAGNOSIS — N898 Other specified noninflammatory disorders of vagina: Secondary | ICD-10-CM | POA: Insufficient documentation

## 2018-02-23 DIAGNOSIS — G5602 Carpal tunnel syndrome, left upper limb: Secondary | ICD-10-CM

## 2018-02-23 NOTE — Patient Instructions (Signed)
Carpal Tunnel Syndrome Carpal tunnel syndrome is a condition that causes pain in your hand and arm. The carpal tunnel is a narrow area located on the palm side of your wrist. Repeated wrist motion or certain diseases may cause swelling within the tunnel. This swelling pinches the main nerve in the wrist (median nerve). What are the causes? This condition may be caused by:  Repeated wrist motions.  Wrist injuries.  Arthritis.  A cyst or tumor in the carpal tunnel.  Fluid buildup during pregnancy.  Sometimes the cause of this condition is not known. What increases the risk? This condition is more likely to develop in:  People who have jobs that cause them to repeatedly move their wrists in the same motion, such as Art gallery manager.  Women.  People with certain conditions, such as: ? Diabetes. ? Obesity. ? An underactive thyroid (hypothyroidism). ? Kidney failure.  What are the signs or symptoms? Symptoms of this condition include:  A tingling feeling in your fingers, especially in your thumb, index, and middle fingers.  Tingling or numbness in your hand.  An aching feeling in your entire arm, especially when your wrist and elbow are bent for long periods of time.  Wrist pain that goes up your arm to your shoulder.  Pain that goes down into your palm or fingers.  A weak feeling in your hands. You may have trouble grabbing and holding items.  Your symptoms may feel worse during the night. How is this diagnosed? This condition is diagnosed with a medical history and physical exam. You may also have tests, including:  An electromyogram (EMG). This test measures electrical signals sent by your nerves into the muscles.  X-rays.  How is this treated? Treatment for this condition includes:  Lifestyle changes. It is important to stop doing or modify the activity that caused your condition.  Physical or occupational therapy.  Medicines for pain and inflammation.  This may include medicine that is injected into your wrist.  A wrist splint.  Surgery.  Follow these instructions at home: If you have a splint:  Wear it as told by your health care provider. Remove it only as told by your health care provider.  Loosen the splint if your fingers become numb and tingle, or if they turn cold and blue.  Keep the splint clean and dry. General instructions  Take over-the-counter and prescription medicines only as told by your health care provider.  Rest your wrist from any activity that may be causing your pain. If your condition is work related, talk to your employer about changes that can be made, such as getting a wrist pad to use while typing.  If directed, apply ice to the painful area: ? Put ice in a plastic bag. ? Place a towel between your skin and the bag. ? Leave the ice on for 20 minutes, 2-3 times per day.  Keep all follow-up visits as told by your health care provider. This is important.  Do any exercises as told by your health care provider, physical therapist, or occupational therapist. Contact a health care provider if:  You have new symptoms.  Your pain is not controlled with medicines.  Your symptoms get worse. This information is not intended to replace advice given to you by your health care provider. Make sure you discuss any questions you have with your health care provider. Document Released: 07/02/2000 Document Revised: 11/13/2015 Document Reviewed: 11/20/2014 Elsevier Interactive Patient Education  2018 [employer].  Vaginitis Vaginitis  is a condition in which the vaginal tissue swells and becomes red (inflamed). This condition is most often caused by a change in the normal balance of bacteria and yeast that live in the vagina. This change causes an overgrowth of certain bacteria or yeast, which causes the inflammation. There are different types of vaginitis, but the most common types are:  Bacterial vaginosis.  Yeast  infection (candidiasis).  Trichomoniasis vaginitis. This is a sexually transmitted disease (STD).  Viral vaginitis.  Atrophic vaginitis.  Allergic vaginitis.  What are the causes? The cause of this condition depends on the type of vaginitis. It can be caused by:  Bacteria (bacterial vaginosis).  Yeast, which is a fungus (yeast infection).  A parasite (trichomoniasis vaginitis).  A virus (viral vaginitis).  Low hormone levels (atrophic vaginitis). Low hormone levels can occur during pregnancy, breastfeeding, or after menopause.  Irritants, such as bubble baths, scented tampons, and feminine sprays (allergic vaginitis).  Other factors can change the normal balance of the yeast and bacteria that live in the vagina. These include:  Antibiotic medicines.  Poor hygiene.  Diaphragms, vaginal sponges, spermicides, birth control pills, and intrauterine devices (IUD).  Sex.  Infection.  Uncontrolled diabetes.  A weakened defense (immune) system.  What increases the risk? This condition is more likely to develop in women who:  Smoke.  Use vaginal douches, scented tampons, or scented sanitary pads.  Wear tight-fitting pants.  Wear thong underwear.  Use oral birth control pills or an IUD.  Have sex without a condom.  Have multiple sex partners.  Have an STD.  Frequently use the spermicide nonoxynol-9.  Eat lots of foods high in sugar.  Have uncontrolled diabetes.  Have low estrogen levels.  Have a weakened immune system from an immune disorder or medical treatment.  Are pregnant or breastfeeding.  What are the signs or symptoms? Symptoms vary depending on the cause of the vaginitis. Common symptoms include:  Abnormal vaginal discharge. ? The discharge is white, gray, or yellow with bacterial vaginosis. ? The discharge is thick, white, and cheesy with a yeast infection. ? The discharge is frothy and yellow or greenish with trichomoniasis.  A bad  vaginal smell. The smell is fishy with bacterial vaginosis.  Vaginal itching, pain, or swelling.  Sex that is painful.  Pain or burning when urinating.  Sometimes there are no symptoms. How is this diagnosed? This condition is diagnosed based on your symptoms and medical history. A physical exam, including a pelvic exam, will also be done. You may also have other tests, including:  Tests to determine the pH level (acidity or alkalinity) of your vagina.  A whiff test, to assess the odor that results when a sample of your vaginal discharge is mixed with a potassium hydroxide solution.  Tests of vaginal fluid. A sample will be examined under a microscope.  How is this treated? Treatment varies depending on the type of vaginitis you have. Your treatment may include:  Antibiotic creams or pills to treat bacterial vaginosis and trichomoniasis.  Antifungal medicines, such as vaginal creams or suppositories, to treat a yeast infection.  Medicine to ease discomfort if you have viral vaginitis. Your sexual partner should also be treated.  Estrogen delivered in a cream, pill, suppository, or vaginal ring to treat atrophic vaginitis. If vaginal dryness occurs, lubricants and moisturizing creams may help. You may need to avoid scented soaps, sprays, or douches.  Stopping use of a product that is causing allergic vaginitis. Then using a vaginal cream  to treat the symptoms.  Follow these instructions at home: Lifestyle  Keep your genital area clean and dry. Avoid soap, and only rinse the area with water.  Do not douche or use tampons until your health care provider says it is okay to do so. Use sanitary pads, if needed.  Do not have sex until your health care provider approves. When you can return to sex, practice safe sex and use condoms.  Wipe from front to back. This avoids the spread of bacteria from the rectum to the vagina. General instructions  Take over-the-counter and  prescription medicines only as told by your health care provider.  If you were prescribed an antibiotic medicine, take or use it as told by your health care provider. Do not stop taking or using the antibiotic even if you start to feel better.  Keep all follow-up visits as told by your health care provider. This is important. How is this prevented?  Use mild, non-scented products. Do not use things that can irritate the vagina, such as fabric softeners. Avoid the following products if they are scented: ? Feminine sprays. ? Detergents. ? Tampons. ? Feminine hygiene products. ? Soaps or bubble baths.  Let air reach your genital area. ? Wear cotton underwear to reduce moisture buildup. ? Avoid wearing underwear while you sleep. ? Avoid wearing tight pants and underwear or nylons without a cotton panel. ? Avoid wearing thong underwear.  Take off any wet clothing, such as bathing suits, as soon as possible.  Practice safe sex and use condoms. Contact a health care provider if:  You have abdominal pain.  You have a fever.  You have symptoms that last for more than 2-3 days. Get help right away if:  You have a fever and your symptoms suddenly get worse. Summary  Vaginitis is a condition in which the vaginal tissue becomes inflamed.This condition is most often caused by a change in the normal balance of bacteria and yeast that live in the vagina.  Treatment varies depending on the type of vaginitis you have.  Do not douche, use tampons , or have sex until your health care provider approves. When you can return to sex, practice safe sex and use condoms. This information is not intended to replace advice given to you by your health care provider. Make sure you discuss any questions you have with your health care provider. Document Released: 05/02/2007 Document Revised: 08/10/2016 Document Reviewed: 08/10/2016 Elsevier Interactive Patient Education  [employer].

## 2018-02-23 NOTE — Progress Notes (Signed)
Subjective:    Patient ID: Mia Carlson, female    DOB: 04/17/1971, 47 y.o.   MRN: [id number]  Chief Complaint  Patient presents with  . Gynecologic Exam    HPI Patient was seen today for acute concern.  Pt endorses vaginal discharge times a few weeks.  D/c noted as yellow in color with a "medicine smell".  Pt denies vaginal irritation, recent unprotected sex, dysuria, hematuria.  Pt treated for suspected UTI 7/11 with Macrobid (Cx was negative) and subsequent yeast infection on 7/29.  LMP 02/16/2018.  Last pap 2017 in [place], [place].  Pt endorses left wrist pain/numbness in left hand.  Feeling wakes pt up at night.  Has urge to shake her hand to relieve the feeling.  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  Past Medical History:  Diagnosis Date  . Hypertension     No Known Allergies  ROS General: Denies fever, chills, night sweats, changes in weight, changes in appetite HEENT: Denies headaches, ear pain, changes in vision, rhinorrhea, sore throat CV: Denies CP, palpitations, SOB, orthopnea Pulm: Denies SOB, cough, wheezing GI: Denies abdominal pain, nausea, vomiting, diarrhea, constipation GU: Denies dysuria, hematuria, frequency, + vaginal discharge Msk: Denies muscle cramps, joint pains  +L hand numbness and tingling. Neuro: Denies weakness, numbness, tingling Skin: Denies rashes, bruising Psych: Denies depression, anxiety, hallucinations     Objective:    Blood pressure (!) 142/70, pulse 87, temperature 98.3 F (36.8 C), temperature source Oral, weight 222 lb 8 oz (100.9 kg), SpO2 98 %.   Gen. Pleasant, well-nourished, in no distress, normal affect   HEENT: [place]/AT, face symmetric, no scleral icterus, PERRLA, nares patent without drainage Lungs: no accessory muscle use, CTAB, no wheezes or rales Cardiovascular: RRR, no m/r/g, no peripheral edema GU: Normal external female genitalia with no lesions noted, no discharge present.  Vaginal  swabs obtained. MSK:  + Phalen's of L hand.  No deformities, negative Tinel sign.  No numbness, tingling, or pain with tapping of medial or lateral epicondyles Neuro:  A&Ox3, CN II-XII intact, normal gait   Wt Readings from Last 3 Encounters:  02/23/18 222 lb 8 oz (100.9 kg)  02/13/18 225 lb 6.4 oz (102.2 kg)  01/26/18 224 lb (101.6 kg)    Lab Results  Component Value Date   WBC 10.7 (H) 07/06/2017   HGB 9.1 (L) 07/06/2017   HCT 29.9 (L) 07/06/2017   PLT 361 07/06/2017   GLUCOSE 101 (H) 07/06/2017   NA 138 07/06/2017   K 3.4 (L) 07/06/2017   CL 107 07/06/2017   CREATININE 0.77 07/06/2017   BUN 8 07/06/2017   CO2 25 07/06/2017    Assessment/Plan:  Vaginal discharge -Discussed various causes including physiologic discharge, STI -will test for BV, trichomoniasis, chlamydia, gonorrhea -We will also obtain HIV and RPR - Plan: RPR, HIV antibody (with reflex), Cervicovaginal ancillary only  Carpal tunnel syndrome of left wrist -Discussed various treatment options -Given handout -Patient to obtain a wrist splint to wear at night.  Follow-up   More than 50% of over 20 minutes spent in total in caring for this patient was spent face-to-face with the patient, counseling and/or coordinating care.    Grier Mitts, MD

## 2018-02-24 LAB — HIV ANTIBODY (ROUTINE TESTING W REFLEX): HIV: NONREACTIVE

## 2018-02-24 LAB — RPR: RPR Ser Ql: NONREACTIVE

## 2018-02-27 LAB — CERVICOVAGINAL ANCILLARY ONLY
Bacterial vaginitis: POSITIVE — AB
Chlamydia: NEGATIVE
NEISSERIA GONORRHEA: NEGATIVE
TRICH (WINDOWPATH): POSITIVE — AB

## 2018-03-01 ENCOUNTER — Other Ambulatory Visit: Payer: Self-pay | Admitting: Family Medicine

## 2018-03-01 DIAGNOSIS — B9689 Other specified bacterial agents as the cause of diseases classified elsewhere: Secondary | ICD-10-CM

## 2018-03-01 DIAGNOSIS — A599 Trichomoniasis, unspecified: Secondary | ICD-10-CM

## 2018-03-01 DIAGNOSIS — N760 Acute vaginitis: Principal | ICD-10-CM

## 2018-03-01 MED ORDER — METRONIDAZOLE 500 MG PO TABS: 500.0000 mg | ORAL_TABLET | Freq: Two times a day (BID) | ORAL | 0 refills | Status: AC

## 2018-03-02 MED FILL — metroNIDAZOLE 500 MG TABS: 500 | 7 days supply | Qty: 14 | Fill #0

## 2018-03-22 ENCOUNTER — Other Ambulatory Visit: Payer: Self-pay | Admitting: Family Medicine

## 2018-03-22 DIAGNOSIS — M79671 Pain in right foot: Secondary | ICD-10-CM

## 2018-03-22 NOTE — Telephone Encounter (Signed)
Refill of Tramadol  LOV 02/23/18 Dr. Volanda Napoleon  [facility] 12/15/17  #60 1 refill  : [place], [place] - [address] [place] [address] Phone: [phone] Fax: [phone]

## 2018-03-22 NOTE — Telephone Encounter (Signed)
Copied from [place] [id number]. Topic: Quick Communication - Rx Refill/Question >> Mar 22, 2018  1:46 PM Burchel, Abbi R wrote: Medication:  traMADol Veatrice Bourbon) 50 MG tablet   Preferred Pharmacy: [facility] [address] [place] [address] Phone: [phone] Fax: [phone]    Pt was advised that RX refills may take up to 3 business days.

## 2018-03-24 NOTE — Telephone Encounter (Signed)
Please advise if ok to refill. 

## 2018-03-28 MED ORDER — TRAMADOL HCL 50 MG PO TABS: 50.0000 mg | ORAL_TABLET | Freq: Two times a day (BID) | ORAL | 1 refills | Status: DC | PRN

## 2018-03-28 MED FILL — traMADol HCL 50 MG TABS: 50 | 30 days supply | Qty: 60 | Fill #0

## 2018-04-07 ENCOUNTER — Ambulatory Visit: Payer: Self-pay | Admitting: *Deleted

## 2018-04-07 ENCOUNTER — Ambulatory Visit (INDEPENDENT_AMBULATORY_CARE_PROVIDER_SITE_OTHER): Payer: Self-pay

## 2018-04-07 ENCOUNTER — Encounter: Payer: Self-pay | Admitting: Adult Health

## 2018-04-07 ENCOUNTER — Ambulatory Visit (INDEPENDENT_AMBULATORY_CARE_PROVIDER_SITE_OTHER): Payer: Self-pay | Admitting: Adult Health

## 2018-04-07 VITALS — BP 160/100 | HR 74 | Temp 97.8°F | Wt 227.0 lb

## 2018-04-07 DIAGNOSIS — M25571 Pain in right ankle and joints of right foot: Secondary | ICD-10-CM

## 2018-04-07 NOTE — Progress Notes (Signed)
Subjective:    Patient ID: Mia Carlson, female    DOB: [birth year], 47 y.o.   MRN: [id number]  HPI  47 year old female who  has a past medical history of Hypertension. She presents to the office today for an acute issue of right ankle pain. Per patient report she missed a step and twisted her right ankle three days ago. She is now experiencing pain and swelling to the right ankle. Pain is worse with ambulation and palpation.   She reports prior surgical history on this ankle.   Has been using Tramadol without relief.   Review of Systems See HPI   Past Medical History:  Diagnosis Date  . Hypertension     Social History   Socioeconomic History  . Marital status: Divorced    Spouse name: Not on file  . Number of children: Not on file  . Years of education: Not on file  . Highest education level: Not on file  Occupational History  . Not on file  Social Needs  . Financial resource strain: Not on file  . Food insecurity:    Worry: Not on file    Inability: Not on file  . Transportation needs:    Medical: Not on file    Non-medical: Not on file  Tobacco Use  . Smoking status: Former Research scientist (life sciences)  . Smokeless tobacco: Never Used  Substance and Sexual Activity  . Alcohol use: No  . Drug use: No  . Sexual activity: Not on file  Lifestyle  . Physical activity:    Days per week: Not on file    Minutes per session: Not on file  . Stress: Not on file  Relationships  . Social connections:    Talks on phone: Not on file    Gets together: Not on file    Attends religious service: Not on file    Active member of club or organization: Not on file    Attends meetings of clubs or organizations: Not on file    Relationship status: Not on file  . Intimate partner violence:    Fear of current or ex partner: Not on file    Emotionally abused: Not on file    Physically abused: Not on file    Forced sexual activity: Not on file  Other Topics Concern  . Not on file  Social  History Narrative  . Not on file    Past Surgical History:  Procedure Laterality Date  . FOOT SURGERY Right     History reviewed. No pertinent family history.  No Known Allergies  Current Outpatient Medications on File Prior to Visit  Medication Sig Dispense Refill  . amLODipine (NORVASC) 10 MG tablet Take 1 tablet (10 mg total) by mouth daily. 90 tablet 1  . benazepril (LOTENSIN) 40 MG tablet Take 1 tablet (40 mg total) by mouth daily. 90 tablet 1  . cyclobenzaprine (FLEXERIL) 5 MG tablet Take 1 tablet (5 mg total) by mouth 3 (three) times daily as needed for muscle spasms. 30 tablet 0  . ferrous sulfate 325 (65 FE) MG tablet Take 1 tablet (325 mg total) by mouth daily with breakfast. 90 tablet 2  . traMADol (ULTRAM) 50 MG tablet Take 1 tablet (50 mg total) by mouth every 12 (twelve) hours as needed for moderate pain. 60 tablet 1   No current facility-administered medications on file prior to visit.     BP (!) 160/100 (BP Location: Left Arm, Patient Position: Sitting, Cuff Size:  Large)   Pulse 74   Temp 97.8 F (36.6 C) (Oral)   Wt 227 lb (103 kg)   SpO2 98%   BMI 39.46 kg/m       Objective:   Physical Exam  Constitutional: She is oriented to person, place, and time. She appears well-developed and well-nourished. No distress.  Cardiovascular: Intact distal pulses.  Musculoskeletal: She exhibits edema and tenderness.  Tenderness and soft tissue swelling noted throughout right ankle. Able to move all digits. Has difficulty with flexion and planter flexion.  - Normal distal pulses   Neurological: She is alert and oriented to person, place, and time.  Skin: Skin is warm and dry. She is not diaphoretic.  Psychiatric: She has a normal mood and affect. Her behavior is normal. Judgment and thought content normal.  Vitals reviewed.     Assessment & Plan:  1. Acute right ankle pain - Placed ace bandage. Did not have air cast large enough  - Advised motrin/elevation/and ice.    - Rest over the weekend - DG Ankle Complete Right; Future - DG Foot Complete Right; Future  Dorothyann Peng, NP

## 2018-04-07 NOTE — Telephone Encounter (Signed)
Pt states 3 days ago she missed a step and twisted her ankle and is now experiencing pain and swelling to the right ankle/foot. Pt states that her right foot does swell sometimes but is has become double the size of her her left ankle. Pt states she is able to walk on foot but experiences pain with walking. Pt states that the ankle is tender to touch and has been taking Tramadol but it has not really helped with the pain. Pt currently rates pain 8 on a scale of 1-10. Pt scheduled for appt today with Cory,NP due to PCP not having available appts today. Pt advised that if symptoms became worse before scheduled appt to seek treatment in the ED. Pt verbalized understanding.  Reason for Disposition . [1] Limp when walking AND [2] due to a twisted ankle or foot  Answer Assessment - Initial Assessment Questions 1. MECHANISM: "How did the injury happen?" (e.g., twisting injury, direct blow)      Twisted right foot after missing a step 2. ONSET: "When did the injury happen?" (Minutes or hours ago)      3 days ago 3. LOCATION: "Where is the injury located?"      Right ankle/foot 4. APPEARANCE of INJURY: "What does the injury look like?"      Right ankle and foot is swollen 5. WEIGHT-BEARING: "Can you put weight on that foot?" "Can you walk (four steps or more)?"       Yes can bear weight on the foot but it is painful and is limping currently 6. SIZE: For cuts, bruises, or swelling, ask: "How large is it?" (e.g., inches or centimeters;  entire joint)      Pt states that ankle is almost double the size 7. PAIN: "Is there pain?" If so, ask: "How bad is the pain?"    (e.g., Scale 1-10; or mild, moderate, severe)     Rates pain at 8 8. TETANUS: For any breaks in the skin, ask: "When was the last tetanus booster?"     n/a 9. OTHER SYMPTOMS: "Do you have any other symptoms?"      No 10. PREGNANCY: "Is there any chance you are pregnant?" "When was your last menstrual period?"       Not assessed  Protocols  used: [place]

## 2018-04-10 NOTE — Telephone Encounter (Signed)
Noted  

## 2018-05-17 MED FILL — BENAZEPRIL HCL 40 MG TABLET: 40 | 90 days supply | Qty: 90 | Fill #1

## 2018-05-17 MED FILL — AMLODIPINE BESYLATE 10 MG T: 10 | 90 days supply | Qty: 90 | Fill #1

## 2018-06-07 MED FILL — traMADol HCL 50 MG TABS: 50 | 30 days supply | Qty: 60 | Fill #1

## 2018-07-14 ENCOUNTER — Other Ambulatory Visit: Payer: Self-pay | Admitting: Family Medicine

## 2018-07-14 DIAGNOSIS — M79671 Pain in right foot: Secondary | ICD-10-CM

## 2018-07-14 NOTE — Telephone Encounter (Signed)
Requested medication (s) are due for refill today: yes  Requested medication (s) are on the active medication list: yes  Last refill:  03/28/18  Future visit scheduled: 03/28/18  Notes to clinic:  No   Requested Prescriptions  Pending Prescriptions Disp Refills   traMADol (ULTRAM) 50 MG tablet 60 tablet 1    Sig: Take 1 tablet (50 mg total) by mouth every 12 (twelve) hours as needed for moderate pain.     Not Delegated - Analgesics:  Opioid Agonists Failed - 07/14/2018  8:08 AM      Failed - This refill cannot be delegated      Failed - Urine Drug Screen completed in last 360 days.      Passed - Valid encounter within last 6 months    Recent Outpatient Visits          3 months ago Acute right ankle pain   Therapist, [facility] at [facility], [place], NP   4 months ago Vaginal discharge   Therapist, music at [facility], MD   5 months ago Monilial vaginitis   Therapist, music at [facility], Doretha Sou, MD   5 months ago [facility], [place], DO   7 months ago Essential hypertension   Therapist, music at [facility], Langley Adie, MD

## 2018-07-14 NOTE — Telephone Encounter (Signed)
Copied from [place] [id number]. Topic: Quick Communication - Rx Refill/Question >> Jul 14, 2018  8:06 AM Conception Chancy, NT wrote: Medication: traMADol (ULTRAM) 50 MG tablet  Has the patient contacted their pharmacy? Yes.  Instructed her to contact her pcp since its a narcotic.   Preferred Pharmacy (with phone number or street name): [place], [place] - [address] [place] [address] Phone: [phone] Fax: [phone]     Agent: Please be advised that RX refills may take up to 3 business days. We ask that you follow-up with your pharmacy.

## 2018-07-21 ENCOUNTER — Ambulatory Visit (INDEPENDENT_AMBULATORY_CARE_PROVIDER_SITE_OTHER): Payer: No Typology Code available for payment source | Admitting: Family Medicine

## 2018-07-21 ENCOUNTER — Other Ambulatory Visit: Payer: Self-pay | Admitting: Family Medicine

## 2018-07-21 ENCOUNTER — Encounter: Payer: Self-pay | Admitting: Family Medicine

## 2018-07-21 ENCOUNTER — Telehealth: Payer: Self-pay | Admitting: Family Medicine

## 2018-07-21 VITALS — BP 132/82 | HR 80 | Temp 98.4°F | Wt 232.0 lb

## 2018-07-21 DIAGNOSIS — G8929 Other chronic pain: Secondary | ICD-10-CM | POA: Diagnosis not present

## 2018-07-21 DIAGNOSIS — M79671 Pain in right foot: Secondary | ICD-10-CM | POA: Diagnosis not present

## 2018-07-21 DIAGNOSIS — I10 Essential (primary) hypertension: Secondary | ICD-10-CM

## 2018-07-21 MED ORDER — TRAMADOL HCL ER 100 MG PO TB24: 100.0000 mg | ORAL_TABLET | Freq: Every day | ORAL | 1 refills | Status: DC | PRN

## 2018-07-21 MED ORDER — TRAMADOL HCL 50 MG PO TABS: 100.0000 mg | ORAL_TABLET | Freq: Three times a day (TID) | ORAL | 0 refills | Status: DC | PRN

## 2018-07-21 MED FILL — traMADol HCL 50 MG TABS: 50 | 10 days supply | Qty: 60 | Fill #0

## 2018-07-21 NOTE — Progress Notes (Signed)
Pt given rx for Tramadol ER 100 mg, however advised by pharmacy prior auth required and on back order.  RX for regular release Tramadol 50 mg take 2 tabs sent to pharmacy until prior auth completed.

## 2018-07-21 NOTE — Telephone Encounter (Signed)
Copied from [employer] [id number]. Topic: Quick Communication - Rx Refill/Question >> Jul 21, 2018  3:25 PM Archer, [place] D wrote: Medication: traMADol (ULTRAM-ER) 100 MG 24 hr tablet / Pt stated that insurance needs a prior authorization from Dr. Volanda Napoleon stating that they have tried other options before going to the 100MG . Also 100MG  is on back order until Tuesday 07/25/18. Pt would like to know if 50MG  can be sent in until then. Please advise.  Has the patient contacted their pharmacy? Yes.   (Agent: If no, request that the patient contact the pharmacy for the refill.) (Agent: If yes, when and what did the pharmacy advise?)  Preferred Pharmacy (with phone number or street name): [place], [place] - [place] [phone] (Phone) [phone] (Fax)    Agent: Please be advised that RX refills may take up to 3 business days. We ask that you follow-up with your pharmacy.

## 2018-07-21 NOTE — Patient Instructions (Signed)
Foot Pain Many things can cause foot pain. Some common causes are:  An injury.  A sprain.  Arthritis.  Blisters.  Bunions. Follow these instructions at home: Pay attention to any changes in your symptoms. Take these actions to help with your discomfort:  If directed, put ice on the affected area: ? Put ice in a plastic bag. ? Place a towel between your skin and the bag. ? Leave the ice on for 15-20 minutes, 3?4 times a day for 2 days.  Take over-the-counter and prescription medicines only as told by your health care provider.  Wear comfortable, supportive shoes that fit you well. Do not wear high heels.  Do not stand or walk for long periods of time.  Do not lift a lot of weight. This can put added pressure on your feet.  Do stretches to relieve foot pain and stiffness as told by your health care provider.  Rub your foot gently.  Keep your feet clean and dry. Contact a health care provider if:  Your pain does not get better after a few days of self-care.  Your pain gets worse.  You cannot stand on your foot. Get help right away if:  Your foot is numb or tingling.  Your foot or toes are swollen.  Your foot or toes turn white or blue.  You have warmth and redness along your foot. This information is not intended to replace advice given to you by your health care provider. Make sure you discuss any questions you have with your health care provider. Document Released: 08/01/2015 Document Revised: 12/11/2015 Document Reviewed: 07/31/2014 Elsevier Interactive Patient Education  2019 [place] is a term that is commonly used to refer to joint pain or joint disease. There are more than 100 types of arthritis. What are the causes? The most common cause of this condition is wear and tear of a joint. Other causes include:  Gout.  Inflammation of a joint.  An infection of a joint.  Sprains and other injuries near the joint.  A drug  reaction or allergic reaction. In some cases, the cause may not be known. What are the signs or symptoms? The main symptom of this condition is pain in the joint with movement. Other symptoms include:  Redness, swelling, or stiffness at a joint.  Warmth coming from the joint.  Fever.  Overall feeling of illness. How is this diagnosed? This condition may be diagnosed with a physical exam and tests, including:  Blood tests.  Urine tests.  Imaging tests, such as MRI, X-rays, or a CT scan. Sometimes, fluid is removed from a joint for testing. How is this treated? Treatment for this condition may involve:  Treatment of the cause, if it is known.  Rest.  Raising (elevating) the joint.  Applying cold or hot packs to the joint.  Medicines to improve symptoms and reduce inflammation.  Injections of a steroid such as cortisone into the joint to help reduce pain and inflammation. Depending on the cause of your arthritis, you may need to make lifestyle changes to reduce stress on your joint. These changes may include exercising more and losing weight. Follow these instructions at home: Medicines  Take over-the-counter and prescription medicines only as told by your health care provider.  Do not take aspirin to relieve pain if gout is suspected. Activity  Rest your joint if told by your health care provider. Rest is important when your disease is active and your joint feels painful, swollen,  or stiff.  Avoid activities that make the pain worse. It is important to balance activity with rest.  Exercise your joint regularly with range-of-motion exercises as told by your health care provider. Try doing low-impact exercise, such as: ? Swimming. ? Water aerobics. ? Biking. ? Walking. Joint Care   If your joint is swollen, keep it elevated if told by your health care provider.  If your joint feels stiff in the morning, try taking a warm shower.  If directed, apply heat to the  joint. If you have diabetes, do not apply heat without permission from your health care provider. ? Put a towel between the joint and the hot pack or heating pad. ? Leave the heat on the area for 20-30 minutes.  If directed, apply ice to the joint: ? Put ice in a plastic bag. ? Place a towel between your skin and the bag. ? Leave the ice on for 20 minutes, 2-3 times per day.  Keep all follow-up visits as told by your health care provider. This is important. Contact a health care provider if:  The pain gets worse.  You have a fever. Get help right away if:  You develop severe joint pain, swelling, or redness.  Many joints become painful and swollen.  You develop severe back pain.  You develop severe weakness in your leg.  You cannot control your bladder or bowels. This information is not intended to replace advice given to you by your health care provider. Make sure you discuss any questions you have with your health care provider. Document Released: 08/12/2004 Document Revised: 12/11/2015 Document Reviewed: 09/30/2014 Elsevier Interactive Patient Education  2019 [employer].
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
## 2018-07-25 NOTE — Telephone Encounter (Signed)
Patient called in for a update on PA

## 2018-07-26 NOTE — Telephone Encounter (Signed)
I sent a prior auth on Tramadol 50mg  tablets to [email or web address] and a note was received stating this was not needed as insurance would cover this.  I called [facility] and spoke with Aaron Edelman and he stated the PA is needed for the 100mg  ER if Dr Volanda Napoleon wants the pt to take this.  Aaron Edelman also stated the pt picked up the Rx for the 50mg  tablet on 1/3.  Dr Volanda Napoleon was informed of this and stated she sent the Rx for 50mg  as she was not sure how long it would take for the PA to be done and the pt would need the 100mg  ER at a later date.  Prior auth for Tramadol 100mg  ER sent to [email or web address].  I called the pt and informed her the PA was sent today and pending insurance review and we will contact her and/or pharmacy with further info.

## 2018-07-28 MED FILL — traMADol HCL ER 100 MG TB24: 100 | 30 days supply | Qty: 30 | Fill #0

## 2018-08-09 ENCOUNTER — Encounter: Payer: Self-pay | Admitting: Family Medicine

## 2018-08-09 NOTE — Telephone Encounter (Signed)
Fax received from [facility] stating the request for Tramadol 100mg  was approved.  I called [facility] and informed Judson Roch of this.  Judson Roch stated the pt already picked up an Rx on 1/10.

## 2018-08-21 ENCOUNTER — Ambulatory Visit: Payer: No Typology Code available for payment source | Admitting: Family Medicine

## 2018-08-21 ENCOUNTER — Encounter: Payer: Self-pay | Admitting: Physical Medicine & Rehabilitation

## 2018-08-23 ENCOUNTER — Other Ambulatory Visit: Payer: Self-pay

## 2018-08-23 ENCOUNTER — Ambulatory Visit (INDEPENDENT_AMBULATORY_CARE_PROVIDER_SITE_OTHER): Payer: No Typology Code available for payment source | Admitting: Family Medicine

## 2018-08-23 ENCOUNTER — Encounter: Payer: Self-pay | Admitting: Family Medicine

## 2018-08-23 VITALS — BP 140/82 | HR 92 | Temp 98.9°F | Wt 229.2 lb

## 2018-08-23 DIAGNOSIS — F5101 Primary insomnia: Secondary | ICD-10-CM | POA: Diagnosis not present

## 2018-08-23 DIAGNOSIS — M79671 Pain in right foot: Secondary | ICD-10-CM | POA: Diagnosis not present

## 2018-08-23 DIAGNOSIS — L299 Pruritus, unspecified: Secondary | ICD-10-CM

## 2018-08-23 DIAGNOSIS — I10 Essential (primary) hypertension: Secondary | ICD-10-CM

## 2018-08-23 MED ORDER — TRIAMTERENE-HCTZ 37.5-25 MG PO TABS: 1.0000 | ORAL_TABLET | Freq: Every day | ORAL | 1 refills | Status: DC

## 2018-08-23 MED ORDER — TRIAMTERENE 50 MG PO CAPS: 50.0000 mg | ORAL_CAPSULE | Freq: Every day | ORAL | 1 refills | Status: DC

## 2018-08-23 MED FILL — TRIAMTERENE/HCTZ 37.5/25 TB: 37.5-25 | 30 days supply | Qty: 30 | Fill #0
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
## 2018-08-23 NOTE — Patient Instructions (Addendum)
You can find melatonin 3 to 5 mg over-the-counter at your local drugstore.  You can try this to see if it helps with your sleeping issues. Hypertension Hypertension, commonly called high blood pressure, is when the force of blood pumping through the arteries is too strong. The arteries are the blood vessels that carry blood from the heart throughout the body. Hypertension forces the heart to work harder to pump blood and may cause arteries to become narrow or stiff. Having untreated or uncontrolled hypertension can cause heart attacks, strokes, kidney disease, and other problems. A blood pressure reading consists of a higher number over a lower number. Ideally, your blood pressure should be below 120/80. The first ("top") number is called the systolic pressure. It is a measure of the pressure in your arteries as your heart beats. The second ("bottom") number is called the diastolic pressure. It is a measure of the pressure in your arteries as the heart relaxes. What are the causes? The cause of this condition is not known. What increases the risk? Some risk factors for high blood pressure are under your control. Others are not. Factors you can change  Smoking.  Having type 2 diabetes mellitus, high cholesterol, or both.  Not getting enough exercise or physical activity.  Being overweight.  Having too much fat, sugar, calories, or salt (sodium) in your diet.  Drinking too much alcohol. Factors that are difficult or impossible to change  Having chronic kidney disease.  Having a family history of high blood pressure.  Age. Risk increases with age.  Race. You may be at higher risk if you are African-American.  Gender. Men are at higher risk than women before age 83. After age 37, women are at higher risk than men.  Having obstructive sleep apnea.  Stress. What are the signs or symptoms? Extremely high blood pressure (hypertensive crisis) may  cause:  Headache.  Anxiety.  Shortness of breath.  Nosebleed.  Nausea and vomiting.  Severe chest pain.  Jerky movements you cannot control (seizures). How is this diagnosed? This condition is diagnosed by measuring your blood pressure while you are seated, with your arm resting on a surface. The cuff of the blood pressure monitor will be placed directly against the skin of your upper arm at the level of your heart. It should be measured at least twice using the same arm. Certain conditions can cause a difference in blood pressure between your right and left arms. Certain factors can cause blood pressure readings to be lower or higher than normal (elevated) for a short period of time:  When your blood pressure is higher when you are in a health care provider's office than when you are at home, this is called white coat hypertension. Most people with this condition do not need medicines.  When your blood pressure is higher at home than when you are in a health care provider's office, this is called masked hypertension. Most people with this condition may need medicines to control blood pressure. If you have a high blood pressure reading during one visit or you have normal blood pressure with other risk factors:  You may be asked to return on a different day to have your blood pressure checked again.  You may be asked to monitor your blood pressure at home for 1 week or longer. If you are diagnosed with hypertension, you may have other blood or imaging tests to help your health care provider understand your overall risk for other conditions. How is  this treated? This condition is treated by making healthy lifestyle changes, such as eating healthy foods, exercising more, and reducing your alcohol intake. Your health care provider may prescribe medicine if lifestyle changes are not enough to get your blood pressure under control, and if:  Your systolic blood pressure is above 130.  Your  diastolic blood pressure is above 80. Your personal target blood pressure may vary depending on your medical conditions, your age, and other factors. Follow these instructions at home: Eating and drinking   Eat a diet that is high in fiber and potassium, and low in sodium, added sugar, and fat. An example eating plan is called the DASH (Dietary Approaches to Stop Hypertension) diet. To eat this way: ? Eat plenty of fresh fruits and vegetables. Try to fill half of your plate at each meal with fruits and vegetables. ? Eat whole grains, such as whole wheat pasta, brown rice, or whole grain bread. Fill about one quarter of your plate with whole grains. ? Eat or drink low-fat dairy products, such as skim milk or low-fat yogurt. ? Avoid fatty cuts of meat, processed or cured meats, and poultry with skin. Fill about one quarter of your plate with lean proteins, such as fish, chicken without skin, beans, eggs, and tofu. ? Avoid premade and processed foods. These tend to be higher in sodium, added sugar, and fat.  Reduce your daily sodium intake. Most people with hypertension should eat less than 1,500 mg of sodium a day.  Limit alcohol intake to no more than 1 drink a day for nonpregnant women and 2 drinks a day for men. One drink equals 12 oz of beer, 5 oz of wine, or 1 oz of hard liquor. Lifestyle   Work with your health care provider to maintain a healthy body weight or to lose weight. Ask what an ideal weight is for you.  Get at least 30 minutes of exercise that causes your heart to beat faster (aerobic exercise) most days of the week. Activities may include walking, swimming, or biking.  Include exercise to strengthen your muscles (resistance exercise), such as pilates or lifting weights, as part of your weekly exercise routine. Try to do these types of exercises for 30 minutes at least 3 days a week.  Do not use any products that contain nicotine or tobacco, such as cigarettes and  e-cigarettes. If you need help quitting, ask your health care provider.  Monitor your blood pressure at home as told by your health care provider.  Keep all follow-up visits as told by your health care provider. This is important. Medicines  Take over-the-counter and prescription medicines only as told by your health care provider. Follow directions carefully. Blood pressure medicines must be taken as prescribed.  Do not skip doses of blood pressure medicine. Doing this puts you at risk for problems and can make the medicine less effective.  Ask your health care provider about side effects or reactions to medicines that you should watch for. Contact a health care provider if:  You think you are having a reaction to a medicine you are taking.  You have headaches that keep coming back (recurring).  You feel dizzy.  You have swelling in your ankles.  You have trouble with your vision. Get help right away if:  You develop a severe headache or confusion.  You have unusual weakness or numbness.  You feel faint.  You have severe pain in your chest or abdomen.  You vomit repeatedly.  You have trouble breathing. Summary  Hypertension is when the force of blood pumping through your arteries is too strong. If this condition is not controlled, it may put you at risk for serious complications.  Your personal target blood pressure may vary depending on your medical conditions, your age, and other factors. For most people, a normal blood pressure is less than 120/80.  Hypertension is treated with lifestyle changes, medicines, or a combination of both. Lifestyle changes include weight loss, eating a healthy, low-sodium diet, exercising more, and limiting alcohol. This information is not intended to replace advice given to you by your health care provider. Make sure you discuss any questions you have with your health care provider. Document Released: 07/05/2005 Document Revised: 06/02/2016  Document Reviewed: 06/02/2016 Elsevier Interactive Patient Education  2019 [employer].  Insomnia Insomnia is a sleep disorder that makes it difficult to fall asleep or stay asleep. Insomnia can cause fatigue, low energy, difficulty concentrating, mood swings, and poor performance at work or school. There are three different ways to classify insomnia:  Difficulty falling asleep.  Difficulty staying asleep.  Waking up too early in the morning. Any type of insomnia can be long-term (chronic) or short-term (acute). Both are common. Short-term insomnia usually lasts for three months or less. Chronic insomnia occurs at least three times a week for longer than three months. What are the causes? Insomnia may be caused by another condition, situation, or substance, such as:  Anxiety.  Certain medicines.  Gastroesophageal reflux disease (GERD) or other gastrointestinal conditions.  Asthma or other breathing conditions.  Restless legs syndrome, sleep apnea, or other sleep disorders.  Chronic pain.  Menopause.  Stroke.  Abuse of alcohol, tobacco, or illegal drugs.  Mental health conditions, such as depression.  Caffeine.  Neurological disorders, such as Alzheimer's disease.  An overactive thyroid (hyperthyroidism). Sometimes, the cause of insomnia may not be known. What increases the risk? Risk factors for insomnia include:  Gender. Women are affected more often than men.  Age. Insomnia is more common as you get older.  Stress.  Lack of exercise.  Irregular work schedule or working night shifts.  Traveling between different time zones.  Certain medical and mental health conditions. What are the signs or symptoms? If you have insomnia, the main symptom is having trouble falling asleep or having trouble staying asleep. This may lead to other symptoms, such as:  Feeling fatigued or having low energy.  Feeling nervous about going to sleep.  Not feeling rested in  the morning.  Having trouble concentrating.  Feeling irritable, anxious, or depressed. How is this diagnosed? This condition may be diagnosed based on:  Your symptoms and medical history. Your health care provider may ask about: ? Your sleep habits. ? Any medical conditions you have. ? Your mental health.  A physical exam. How is this treated? Treatment for insomnia depends on the cause. Treatment may focus on treating an underlying condition that is causing insomnia. Treatment may also include:  Medicines to help you sleep.  Counseling or therapy.  Lifestyle adjustments to help you sleep better. Follow these instructions at home: Eating and drinking   Limit or avoid alcohol, caffeinated beverages, and cigarettes, especially close to bedtime. These can disrupt your sleep.  Do not eat a large meal or eat spicy foods right before bedtime. This can lead to digestive discomfort that can make it hard for you to sleep. Sleep habits   Keep a sleep diary to help you and your health  care provider figure out what could be causing your insomnia. Write down: ? When you sleep. ? When you wake up during the night. ? How well you sleep. ? How rested you feel the next day. ? Any side effects of medicines you are taking. ? What you eat and drink.  Make your bedroom a dark, comfortable place where it is easy to fall asleep. ? Put up shades or blackout curtains to block light from outside. ? Use a white noise machine to block noise. ? Keep the temperature cool.  Limit screen use before bedtime. This includes: ? Watching TV. ? Using your smartphone, tablet, or computer.  Stick to a routine that includes going to bed and waking up at the same times every day and night. This can help you fall asleep faster. Consider making a quiet activity, such as reading, part of your nighttime routine.  Try to avoid taking naps during the day so that you sleep better at night.  Get out of bed if you  are still awake after 15 minutes of trying to sleep. Keep the lights down, but try reading or doing a quiet activity. When you feel sleepy, go back to bed. General instructions  Take over-the-counter and prescription medicines only as told by your health care provider.  Exercise regularly, as told by your health care provider. Avoid exercise starting several hours before bedtime.  Use relaxation techniques to manage stress. Ask your health care provider to suggest some techniques that may work well for you. These may include: ? Breathing exercises. ? Routines to release muscle tension. ? Visualizing peaceful scenes.  Make sure that you drive carefully. Avoid driving if you feel very sleepy.  Keep all follow-up visits as told by your health care provider. This is important. Contact a health care provider if:  You are tired throughout the day.  You have trouble in your daily routine due to sleepiness.  You continue to have sleep problems, or your sleep problems get worse. Get help right away if:  You have serious thoughts about hurting yourself or someone else. If you ever feel like you may hurt yourself or others, or have thoughts about taking your own life, get help right away. You can go to your nearest emergency department or call:  Your local emergency services (911 in the [place]  A suicide crisis helpline, such as the [place] at (203)163-8496. This is open 24 hours a day. Summary  Insomnia is a sleep disorder that makes it difficult to fall asleep or stay asleep.  Insomnia can be long-term (chronic) or short-term (acute).  Treatment for insomnia depends on the cause. Treatment may focus on treating an underlying condition that is causing insomnia.  Keep a sleep diary to help you and your health care provider figure out what could be causing your insomnia. This information is not intended to replace advice given to you by your health care  provider. Make sure you discuss any questions you have with your health care provider. Document Released: 07/02/2000 Document Revised: 04/14/2017 Document Reviewed: 04/14/2017 Elsevier Interactive Patient Education  2019 [employer].

## 2018-08-25 MED FILL — TRAMADOL ER 100 MG TABLET: 100 | 30 days supply | Qty: 30 | Fill #1

## 2018-08-31 MED FILL — IBUPROFEN 400 MG TABS: 400 | 10 days supply | Qty: 30 | Fill #0

## 2018-08-31 MED FILL — traMADol HCL 50 MG TABS: 50 | 2 days supply | Qty: 9 | Fill #0

## 2018-08-31 MED FILL — CHLORHEXIDINE 0.12% RINSE: 0.12 | 15 days supply | Qty: 473 | Fill #0

## 2018-09-06 ENCOUNTER — Encounter
Payer: No Typology Code available for payment source | Attending: Physical Medicine & Rehabilitation | Admitting: Physical Medicine & Rehabilitation

## 2018-09-06 ENCOUNTER — Other Ambulatory Visit: Payer: Self-pay

## 2018-09-06 ENCOUNTER — Encounter: Payer: Self-pay | Admitting: Physical Medicine & Rehabilitation

## 2018-09-06 VITALS — BP 144/94 | HR 91 | Ht 61.75 in | Wt 221.0 lb

## 2018-09-06 DIAGNOSIS — R269 Unspecified abnormalities of gait and mobility: Secondary | ICD-10-CM | POA: Insufficient documentation

## 2018-09-06 DIAGNOSIS — Z87891 Personal history of nicotine dependence: Secondary | ICD-10-CM | POA: Insufficient documentation

## 2018-09-06 DIAGNOSIS — Z6841 Body Mass Index (BMI) 40.0 and over, adult: Secondary | ICD-10-CM | POA: Insufficient documentation

## 2018-09-06 DIAGNOSIS — M792 Neuralgia and neuritis, unspecified: Secondary | ICD-10-CM

## 2018-09-06 DIAGNOSIS — G8929 Other chronic pain: Secondary | ICD-10-CM | POA: Insufficient documentation

## 2018-09-06 DIAGNOSIS — G479 Sleep disorder, unspecified: Secondary | ICD-10-CM | POA: Insufficient documentation

## 2018-09-06 DIAGNOSIS — I10 Essential (primary) hypertension: Secondary | ICD-10-CM | POA: Insufficient documentation

## 2018-09-06 DIAGNOSIS — M79671 Pain in right foot: Secondary | ICD-10-CM | POA: Diagnosis not present

## 2018-09-06 MED ORDER — DICLOFENAC SODIUM 1 % TD GEL: 2.0000 g | Freq: Four times a day (QID) | TRANSDERMAL | 1 refills | Status: DC

## 2018-09-06 MED ORDER — GABAPENTIN 100 MG PO CAPS: 100.0000 mg | ORAL_CAPSULE | Freq: Every day | ORAL | 1 refills | Status: DC

## 2018-09-06 MED FILL — DICLOFENAC SODIUM 1 % GEL: 1 | 12 days supply | Qty: 100 | Fill #0

## 2018-09-06 MED FILL — GABAPENTIN 100 MG CAPSULE: 100 | 30 days supply | Qty: 30 | Fill #0
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
## 2018-09-07 LAB — VITAMIN B12: VITAMIN B 12: 392 pg/mL (ref 232–1245)

## 2018-09-07 LAB — VITAMIN D 25 HYDROXY (VIT D DEFICIENCY, FRACTURES): Vit D, 25-Hydroxy: 10.5 ng/mL — ABNORMAL LOW (ref 30.0–100.0)

## 2018-09-18 ENCOUNTER — Encounter: Payer: Self-pay | Admitting: Registered"

## 2018-09-18 ENCOUNTER — Encounter: Payer: No Typology Code available for payment source | Attending: Family Medicine | Admitting: Registered"

## 2018-09-18 DIAGNOSIS — E669 Obesity, unspecified: Secondary | ICD-10-CM | POA: Insufficient documentation

## 2018-09-18 NOTE — Patient Instructions (Addendum)
-   Aim to balance meals with protein + vegetables + carbohydrates + water with meals  -  Aim to not skip meals. Have 3 meals a day.   - Add in meal option before going to work.   - Listen to your body for hunger cues.   - Snacks should include carbohydrates + protein.   - Keep up the great work with working out and being intentional about making changes for your health!

## 2018-09-18 NOTE — Progress Notes (Signed)
Medical Nutrition Therapy:  Appt start time: 8:50 end time:  10:00.   Assessment:  Primary concerns today: has hypertension. Pt arrives stating she is trying to lose weight to relieve pressure off foot. Pt states she started weight watchers 1 month ago and weight is currently at a plateau. Pt states she is more focused on decreasing blood pressure.  Pt states she used to eat out a lot until about a month ago before joining [employer] with friend. Pt states she is now eating a lot of salads, less sugar, eating less fried and more baked options, more whole wheat bread, no sodas (although loves them), and using mustard because it does not have any calories. Pt states she is still hungry at the end of the day.   Pt states she works 3rd shift Sleeps 2pm-9pm Works 11pm - 7:30am  Lunch break 3-3:30: meal 5-6am: physical activity 6:30am: meal 11-12 pm: meal  Pt reports most women in her family are fat; states obesity runs in her family. Pt states she does not want to get older and not be able to function due to weight therefore wants to lose weight. Pt states she has been alcohol-free and smoke-free since Nov 2019. Pt states she craves a box of starch during the week.   Preferred Learning Style:   No preference indicated   Learning Readiness:   Contemplating  Ready  Change in progress   MEDICATIONS: See list   DIETARY INTAKE:  Usual eating pattern includes 2-3 meals and 3 snacks per day.  Everyday foods include sandwich, fruit, salad, juice, cereal, whole milk, baked chicken, vegetables.  Avoided foods include pasta, sodas.    24-hr recall:  B ( AM): Special K cereal + whole milk + apple or grits  Snk ( AM): fruit  L ( PM): [place] sandwich + wheat toast + salad + juicy juice + banana  Snk ( PM): fruit D ( PM): sometimes skips; baked chicken + boiled carrots + cabbage  Snk ( PM): fruit Beverages: whole milk, coffee, juice, water with flavor packs (3-5 bottles, 48-80 oz),  hot tea with lemon  Usual physical activity: gym-elliptical 30 min, bike 30 min, 7 days/week  Estimated energy needs: 2000-2200 calories 225-248 g carbohydrates 150-165 g protein 56-61 g fat  Progress Towards Goal(s):  In progress.   Nutritional Diagnosis:  NB-1.1 Food and nutrition-related knowledge deficit As related to hypertension.  As evidenced by pt verbalizes incomplete knowledge.    Intervention:  Nutrition education and counseling. Pt was educated and counseled on heart health, importance of reducing sodium intake, ways to season/flavor food without salt, other ways to prepare protein foods other than frying, and having balanced meals/snacks. Pt was also encouraged to keep up great work with physical activity. Pt was in agreement with goals listed.  Goals: - Aim to balance meals with protein + vegetables + carbohydrates + water with meals -  Aim to not skip meals. Have 3 meals a day.  - Add in meal option before going to work.  - Listen to your body for hunger cues.  - Snacks should include carbohydrates + protein.  - Keep up the great work with working out and being intentional about making changes for your health!   Teaching Method Utilized:  Visual Auditory Hands on  Handouts given during visit include:  Heart-Healthy Nutrition Therapy  Barriers to learning/adherence to lifestyle change: preparation stage of change  Demonstrated degree of understanding via:  Teach Back   Monitoring/Evaluation:  Dietary intake, exercise, and body weight in 1 month(s).

## 2018-09-21 ENCOUNTER — Encounter: Payer: Self-pay | Admitting: Family Medicine

## 2018-09-21 ENCOUNTER — Ambulatory Visit (INDEPENDENT_AMBULATORY_CARE_PROVIDER_SITE_OTHER): Payer: No Typology Code available for payment source | Admitting: Family Medicine

## 2018-09-21 VITALS — BP 136/92 | HR 80 | Temp 98.2°F | Wt 228.0 lb

## 2018-09-21 DIAGNOSIS — M545 Low back pain, unspecified: Secondary | ICD-10-CM

## 2018-09-21 DIAGNOSIS — G8929 Other chronic pain: Secondary | ICD-10-CM

## 2018-09-21 DIAGNOSIS — I10 Essential (primary) hypertension: Secondary | ICD-10-CM | POA: Diagnosis not present

## 2018-09-21 DIAGNOSIS — J4520 Mild intermittent asthma, uncomplicated: Secondary | ICD-10-CM | POA: Diagnosis not present

## 2018-09-21 DIAGNOSIS — M79671 Pain in right foot: Secondary | ICD-10-CM

## 2018-09-21 LAB — BASIC METABOLIC PANEL
BUN: 15 mg/dL (ref 6–23)
CO2: 26 mEq/L (ref 19–32)
Calcium: 8.8 mg/dL (ref 8.4–10.5)
Chloride: 105 mEq/L (ref 96–112)
Creatinine, Ser: 0.88 mg/dL (ref 0.40–1.20)
GFR: 83.04 mL/min (ref >60.00)
GLUCOSE: 74 mg/dL (ref 70–99)
Potassium: 4.4 mEq/L (ref 3.5–5.1)
Sodium: 138 mEq/L (ref 135–145)

## 2018-09-21 MED ORDER — AMLODIPINE BESYLATE 10 MG PO TABS: 10.0000 mg | ORAL_TABLET | Freq: Every day | ORAL | 2 refills | Status: DC

## 2018-09-21 MED ORDER — TRAMADOL HCL ER 100 MG PO TB24: 100.0000 mg | ORAL_TABLET | Freq: Every day | ORAL | 1 refills | Status: DC | PRN

## 2018-09-21 MED ORDER — CYCLOBENZAPRINE HCL 5 MG PO TABS: 5.0000 mg | ORAL_TABLET | Freq: Three times a day (TID) | ORAL | 1 refills | Status: DC | PRN

## 2018-09-21 MED ORDER — ALBUTEROL SULFATE 108 (90 BASE) MCG/ACT IN AEPB: 2.0000 | INHALATION_SPRAY | Freq: Four times a day (QID) | RESPIRATORY_TRACT | 1 refills | Status: AC | PRN

## 2018-09-21 MED ORDER — TRIAMTERENE-HCTZ 37.5-25 MG PO TABS: 1.0000 | ORAL_TABLET | Freq: Every day | ORAL | 1 refills | Status: DC

## 2018-09-21 MED FILL — PROAIR RESPICLICK INHAL PWD: 108 (90 BAS | 30 days supply | Qty: 1 | Fill #0

## 2018-09-21 MED FILL — TRIAMTERENE/HCTZ 37.5/25 TB: 37.5-25 | 90 days supply | Qty: 90 | Fill #0

## 2018-09-21 MED FILL — AMLODIPINE BESYLATE 10 MG T: 10 | 90 days supply | Qty: 90 | Fill #0

## 2018-09-21 MED FILL — TRAMADOL ER 100 MG TABLET: 100 | 60 days supply | Qty: 60 | Fill #0

## 2018-09-21 MED FILL — CYCLOBENZAPRINE HCL 5 MG TA: 5 | 20 days supply | Qty: 60 | Fill #0

## 2018-09-21 NOTE — Patient Instructions (Signed)
Asthma, Adult  Asthma is a long-term (chronic) condition that causes recurrent episodes in which the airways become tight and narrow. The airways are the passages that lead from the nose and mouth down into the lungs. Asthma episodes, also called asthma attacks, can cause coughing, wheezing, shortness of breath, and chest pain. The airways can also fill with mucus. During an attack, it can be difficult to breathe. Asthma attacks can range from minor to life threatening. Asthma cannot be cured, but medicines and lifestyle changes can help control it and treat acute attacks. What are the causes? This condition is believed to be caused by inherited (genetic) and environmental factors, but its exact cause is not known. There are many things that can bring on an asthma attack or make asthma symptoms worse (triggers). Asthma triggers are different for each person. Common triggers include:  Mold.  Dust.  Cigarette smoke.  Cockroaches.  Things that can cause allergy symptoms (allergens), such as animal dander or pollen from trees or grass.  Air pollutants such as household cleaners, wood smoke, smog, or Advertising account planner.  Cold air, weather changes, and winds (which increase molds and pollen in the air).  Strong emotional expressions such as crying or laughing hard.  Stress.  Certain medicines (such as aspirin) or types of medicines (such as beta-blockers).  Sulfites in foods and drinks. Foods and drinks that may contain sulfites include dried fruit, potato chips, and sparkling grape juice.  Infections or inflammatory conditions such as the flu, a cold, or inflammation of the nasal membranes (rhinitis).  Gastroesophageal reflux disease (GERD).  Exercise or strenuous activity. What are the signs or symptoms? Symptoms of this condition may occur right after asthma is triggered or many hours later. Symptoms include:  Wheezing. This can sound like whistling when you breathe.  Excessive  nighttime or early morning coughing.  Frequent or severe coughing with a common cold.  Chest tightness.  Shortness of breath.  Tiredness (fatigue) with minimal activity. How is this diagnosed? This condition is diagnosed based on:  Your medical history.  A physical exam.  Tests, which may include: ? Lung function studies and pulmonary studies (spirometry). These tests can evaluate the flow of air in your lungs. ? Allergy tests. ? Imaging tests, such as X-rays. How is this treated? There is no cure for this condition, but treatment can help control your symptoms. Treatment for asthma usually involves:  Identifying and avoiding your asthma triggers.  Using medicines to control your symptoms. Generally, two types of medicines are used to treat asthma: ? Controller medicines. These help prevent asthma symptoms from occurring. They are usually taken every day. ? Fast-acting reliever or rescue medicines. These quickly relieve asthma symptoms by widening the narrow and tight airways. They are used as needed and provide short-term relief.  Using supplemental oxygen. This may be needed during a severe episode.  Using other medicines, such as: ? Allergy medicines, such as antihistamines, if your asthma attacks are triggered by allergens. ? Immune medicines (immunomodulators). These are medicines that help control the immune system.  Creating an asthma action plan. An asthma action plan is a written plan for managing and treating your asthma attacks. This plan includes: ? A list of your asthma triggers and how to avoid them. ? Information about when medicines should be taken and when their dosage should be changed. ? Instructions about using a device called a peak flow meter. A peak flow meter measures how well the lungs are working and the  severity of your asthma. It helps you monitor your condition. Follow these instructions at home: Controlling your home environment Control your home  environment in the following ways to help avoid triggers and prevent asthma attacks:  Change your heating and air conditioning filter regularly.  Limit your use of fireplaces and wood stoves.  Get rid of pests (such as roaches and mice) and their droppings.  Throw away plants if you see mold on them.  Clean floors and dust surfaces regularly. Use unscented cleaning products.  Try to have someone else vacuum for you regularly. Stay out of rooms while they are being vacuumed and for a short while afterward. If you vacuum, use a dust mask from a hardware store, a double-layered or microfilter vacuum cleaner bag, or a vacuum cleaner with a HEPA filter.  Replace carpet with wood, tile, or vinyl flooring. Carpet can trap dander and dust.  Use allergy-proof pillows, mattress covers, and box spring covers.  Keep your bedroom a trigger-free room.  Avoid pets and keep windows closed when allergens are in the air.  Wash beddings every week in hot water and dry them in a dryer.  Use blankets that are made of polyester or cotton.  Clean bathrooms and kitchens with bleach. If possible, have someone repaint the walls in these rooms with mold-resistant paint. Stay out of the rooms that are being cleaned and painted.  Wash your hands often with soap and water. If soap and water are not available, use hand sanitizer.  Do not allow anyone to smoke in your home. General instructions  Take over-the-counter and prescription medicines only as told by your health care provider. ? Speak with your health care provider if you have questions about how or when to take the medicines. ? Make note if you are requiring more frequent dosages.  Do not use any products that contain nicotine or tobacco, such as cigarettes and e-cigarettes. If you need help quitting, ask your health care provider. Also, avoid being exposed to secondhand smoke.  Use a peak flow meter as told by your health care provider. Record and  keep track of the readings.  Understand and use the asthma action plan to help minimize, or stop an asthma attack, without needing to seek medical care.  Make sure you stay up to date on your yearly vaccinations as told by your health care provider. This may include vaccines for the flu and pneumonia.  Avoid outdoor activities when allergen counts are high and when air quality is low.  Wear a ski mask that covers your nose and mouth during outdoor winter activities. Exercise indoors on cold days if you can.  Warm up before exercising, and take time for a cool-down period after exercise.  Keep all follow-up visits as told by your health care provider. This is important. Where to find more information  For information about asthma, turn to the Centers for Disease Control and Prevention at www.cdc.gov/asthma/faqs.htm  For air quality information, turn to AirNow at https://airnow.gov/ Contact a health care provider if:  You have wheezing, shortness of breath, or a cough even while you are taking medicine to prevent attacks.  The mucus you cough up (sputum) is thicker than usual.  Your sputum changes from clear or white to yellow, green, gray, or bloody.  Your medicines are causing side effects, such as a rash, itching, swelling, or trouble breathing.  You need to use a reliever medicine more than 2-3 times a week.  Your peak flow reading   is still at 50-79% of your personal best after following your action plan for 1 hour.  You have a fever. Get help right away if:  You are getting worse and do not respond to treatment during an asthma attack.  You are short of breath when at rest or when doing very little physical activity.  You have difficulty eating, drinking, or talking.  You have chest pain or tightness.  You develop a fast heartbeat or palpitations.  You have a bluish color to your lips or fingernails.  You are light-headed or dizzy, or you faint.  Your peak flow  reading is less than 50% of your personal best.  You feel too tired to breathe normally. Summary  Asthma is a long-term (chronic) condition that causes recurrent episodes in which the airways become tight and narrow. These episodes can cause coughing, wheezing, shortness of breath, and chest pain.  Asthma cannot be cured, but medicines and lifestyle changes can help control it and treat acute attacks.  Make sure you understand how to avoid triggers and how and when to use your medicines.  Asthma attacks can range from minor to life threatening. Get help right away if you have an asthma attack and do not respond to treatment with your usual rescue medicines. This information is not intended to replace advice given to you by your health care provider. Make sure you discuss any questions you have with your health care provider. Document Released: 07/05/2005 Document Revised: 08/09/2016 Document Reviewed: 08/09/2016 Elsevier Interactive Patient Education  2019 [employer].  Managing Your Hypertension Hypertension is commonly called high blood pressure. This is when the force of your blood pressing against the walls of your arteries is too strong. Arteries are blood vessels that carry blood from your heart throughout your body. Hypertension forces the heart to work harder to pump blood, and may cause the arteries to become narrow or stiff. Having untreated or uncontrolled hypertension can cause heart attack, stroke, kidney disease, and other problems. What are blood pressure readings? A blood pressure reading consists of a higher number over a lower number. Ideally, your blood pressure should be below 120/80. The first ("top") number is called the systolic pressure. It is a measure of the pressure in your arteries as your heart beats. The second ("bottom") number is called the diastolic pressure. It is a measure of the pressure in your arteries as the heart relaxes. What does my blood pressure  reading mean? Blood pressure is classified into four stages. Based on your blood pressure reading, your health care provider may use the following stages to determine what type of treatment you need, if any. Systolic pressure and diastolic pressure are measured in a unit called mm Hg. Normal  Systolic pressure: below 275.  Diastolic pressure: below 80. Elevated  Systolic pressure: 170-017.  Diastolic pressure: below 80. Hypertension stage 1  Systolic pressure: 494-496.  Diastolic pressure: 75-91. Hypertension stage 2  Systolic pressure: 638 or above.  Diastolic pressure: 90 or above. What health risks are associated with hypertension? Managing your hypertension is an important responsibility. Uncontrolled hypertension can lead to:  A heart attack.  A stroke.  A weakened blood vessel (aneurysm).  Heart failure.  Kidney damage.  Eye damage.  Metabolic syndrome.  Memory and concentration problems. What changes can I make to manage my hypertension? Hypertension can be managed by making lifestyle changes and possibly by taking medicines. Your health care provider will help you make a plan to bring your blood  pressure within a normal range. Eating and drinking   Eat a diet that is high in fiber and potassium, and low in salt (sodium), added sugar, and fat. An example eating plan is called the DASH (Dietary Approaches to Stop Hypertension) diet. To eat this way: ? Eat plenty of fresh fruits and vegetables. Try to fill half of your plate at each meal with fruits and vegetables. ? Eat whole grains, such as whole wheat pasta, brown rice, or whole grain bread. Fill about one quarter of your plate with whole grains. ? Eat low-fat diary products. ? Avoid fatty cuts of meat, processed or cured meats, and poultry with skin. Fill about one quarter of your plate with lean proteins such as fish, chicken without skin, beans, eggs, and tofu. ? Avoid premade and processed foods. These  tend to be higher in sodium, added sugar, and fat.  Reduce your daily sodium intake. Most people with hypertension should eat less than 1,500 mg of sodium a day.  Limit alcohol intake to no more than 1 drink a day for nonpregnant women and 2 drinks a day for men. One drink equals 12 oz of beer, 5 oz of wine, or 1 oz of hard liquor. Lifestyle  Work with your health care provider to maintain a healthy body weight, or to lose weight. Ask what an ideal weight is for you.  Get at least 30 minutes of exercise that causes your heart to beat faster (aerobic exercise) most days of the week. Activities may include walking, swimming, or biking.  Include exercise to strengthen your muscles (resistance exercise), such as weight lifting, as part of your weekly exercise routine. Try to do these types of exercises for 30 minutes at least 3 days a week.  Do not use any products that contain nicotine or tobacco, such as cigarettes and e-cigarettes. If you need help quitting, ask your health care provider.  Control any long-term (chronic) conditions you have, such as high cholesterol or diabetes. Monitoring  Monitor your blood pressure at home as told by your health care provider. Your personal target blood pressure may vary depending on your medical conditions, your age, and other factors.  Have your blood pressure checked regularly, as often as told by your health care provider. Working with your health care provider  Review all the medicines you take with your health care provider because there may be side effects or interactions.  Talk with your health care provider about your diet, exercise habits, and other lifestyle factors that may be contributing to hypertension.  Visit your health care provider regularly. Your health care provider can help you create and adjust your plan for managing hypertension. Will I need medicine to control my blood pressure? Your health care provider may prescribe medicine  if lifestyle changes are not enough to get your blood pressure under control, and if:  Your systolic blood pressure is 130 or higher.  Your diastolic blood pressure is 80 or higher. Take medicines only as told by your health care provider. Follow the directions carefully. Blood pressure medicines must be taken as prescribed. The medicine does not work as well when you skip doses. Skipping doses also puts you at risk for problems. Contact a health care provider if:  You think you are having a reaction to medicines you have taken.  You have repeated (recurrent) headaches.  You feel dizzy.  You have swelling in your ankles.  You have trouble with your vision. Get help right away if:  You develop a severe headache or confusion.  You have unusual weakness or numbness, or you feel faint.  You have severe pain in your chest or abdomen.  You vomit repeatedly.  You have trouble breathing. Summary  Hypertension is when the force of blood pumping through your arteries is too strong. If this condition is not controlled, it may put you at risk for serious complications.  Your personal target blood pressure may vary depending on your medical conditions, your age, and other factors. For most people, a normal blood pressure is less than 120/80.  Hypertension is managed by lifestyle changes, medicines, or both. Lifestyle changes include weight loss, eating a healthy, low-sodium diet, exercising more, and limiting alcohol. This information is not intended to replace advice given to you by your health care provider. Make sure you discuss any questions you have with your health care provider. Document Released: 03/29/2012 Document Revised: 06/02/2016 Document Reviewed: 06/02/2016 Elsevier Interactive Patient Education  2019 [employer].

## 2018-09-21 NOTE — Progress Notes (Signed)
Subjective:    Patient ID: Mia Carlson, female    DOB: Dec 10, 1970, 48 y.o.   MRN: [id number]  No chief complaint on file.   HPI Patient was seen today for f/u and acute concern.  Pt following up on blood pressure.  Taking Norvasc 10 mg and Triamterene-hydrochlorothiazide 37-25 mg daily.  Pt requesting refills.  Pt states she has not been checking her BP at home.  States has been feeling well.  Denies headaches, changes in vision, chest pain.  Pt with history of asthma.  Reports wheezing this morning.  Pt denies coughing, sore throat, ear pain or pressure, facial pain or pressure.  Pt states she was started seeing pain management.  Pt states the provider there did not want to refill her tramadol.  Instead she was given Voltaren gel for her chronic foot/ankle pain.  Pt states the gel has done nothing for her symptoms.  She does endorse gabapentin helping with nerve pain.  Pt taking gabapentin at night.  Requesting refill on flexeril.  Past Medical History:  Diagnosis Date  . Hypertension     Allergies  Allergen Reactions  . Eggs Or Egg-Derived Products Nausea And Vomiting    ROS General: Denies fever, chills, night sweats, changes in weight, changes in appetite HEENT: Denies headaches, ear pain, changes in vision, rhinorrhea, sore throat CV: Denies CP, palpitations, SOB, orthopnea Pulm: Denies SOB, cough, wheezing GI: Denies abdominal pain, nausea, vomiting, diarrhea, constipation GU: Denies dysuria, hematuria, frequency, vaginal discharge Msk: Denies muscle cramps  +foot pain, back pain/sciatica Neuro: Denies weakness, numbness, tingling Skin: Denies rashes, bruising Psych: Denies depression, anxiety, hallucinations    Objective:    Blood pressure (!) 136/92, pulse 80, temperature 98.2 F (36.8 C), temperature source Oral, weight 228 lb (103.4 kg), SpO2 99 %.  Gen. Pleasant, well-nourished, in no distress, normal affect   HEENT: [place]/AT, face symmetric, no scleral  icterus, PERRLA, nares patent without drainage. Lungs: no accessory muscle use, CTAB, no wheezes or rales Cardiovascular: RRR, no m/r/g, no peripheral edema Neuro:  A&Ox3, CN II-XII intact, normal gait Skin:  Warm, no lesions/ rash  Wt Readings from Last 3 Encounters:  09/21/18 228 lb (103.4 kg)  09/06/18 221 lb (100.2 kg)  08/23/18 229 lb 3.2 oz (104 kg)    Lab Results  Component Value Date   WBC 10.7 (H) 07/06/2017   HGB 9.1 (L) 07/06/2017   HCT 29.9 (L) 07/06/2017   PLT 361 07/06/2017   GLUCOSE 101 (H) 07/06/2017   NA 138 07/06/2017   K 3.4 (L) 07/06/2017   CL 107 07/06/2017   CREATININE 0.77 07/06/2017   BUN 8 07/06/2017   CO2 25 07/06/2017    Assessment/Plan:  Mild intermittent asthma without complication  - Plan: Albuterol Sulfate (PROAIR RESPICLICK) 972 (90 Base) MCG/ACT AEPB  Essential hypertension  -elevated. -Continue current medication -discussed lifestyle modifications - Plan: amLODipine (NORVASC) 10 MG tablet, triamterene-hydrochlorothiazide (MAXZIDE-25) 37.5-25 MG tablet, Basic metabolic panel  Acute bilateral low back pain without sciatica  - Plan: cyclobenzaprine (FLEXERIL) 5 MG tablet  Chronic foot pain, right  -pt encourages to continue f/u with pain management to discuss other options for chronic pain since voltaren gel is not working. - Plan: traMADol (ULTRAM-ER) 100 MG 24 hr tablet  F/u prn in the next 2-3 months  Grier Mitts, MD

## 2018-10-10 MED FILL — GABAPENTIN 100 MG CAPSULE: 100 | 30 days supply | Qty: 30 | Fill #1

## 2018-10-16 ENCOUNTER — Ambulatory Visit: Payer: No Typology Code available for payment source | Admitting: Physical Medicine & Rehabilitation

## 2018-10-19 ENCOUNTER — Encounter: Payer: Self-pay | Admitting: Physical Medicine & Rehabilitation

## 2018-10-19 ENCOUNTER — Other Ambulatory Visit: Payer: Self-pay

## 2018-10-19 ENCOUNTER — Encounter
Payer: No Typology Code available for payment source | Attending: Physical Medicine & Rehabilitation | Admitting: Physical Medicine & Rehabilitation

## 2018-10-19 VITALS — BP 132/92 | HR 75 | Wt 228.0 lb

## 2018-10-19 DIAGNOSIS — Z6841 Body Mass Index (BMI) 40.0 and over, adult: Secondary | ICD-10-CM | POA: Insufficient documentation

## 2018-10-19 DIAGNOSIS — E538 Deficiency of other specified B group vitamins: Secondary | ICD-10-CM

## 2018-10-19 DIAGNOSIS — E559 Vitamin D deficiency, unspecified: Secondary | ICD-10-CM

## 2018-10-19 DIAGNOSIS — G479 Sleep disorder, unspecified: Secondary | ICD-10-CM | POA: Insufficient documentation

## 2018-10-19 DIAGNOSIS — Z87891 Personal history of nicotine dependence: Secondary | ICD-10-CM | POA: Insufficient documentation

## 2018-10-19 DIAGNOSIS — R269 Unspecified abnormalities of gait and mobility: Secondary | ICD-10-CM | POA: Insufficient documentation

## 2018-10-19 DIAGNOSIS — M792 Neuralgia and neuritis, unspecified: Secondary | ICD-10-CM

## 2018-10-19 DIAGNOSIS — M79671 Pain in right foot: Secondary | ICD-10-CM | POA: Insufficient documentation

## 2018-10-19 DIAGNOSIS — G8929 Other chronic pain: Secondary | ICD-10-CM | POA: Insufficient documentation

## 2018-10-19 DIAGNOSIS — I10 Essential (primary) hypertension: Secondary | ICD-10-CM | POA: Insufficient documentation

## 2018-10-19 MED ORDER — GABAPENTIN 100 MG PO CAPS: 100.0000 mg | ORAL_CAPSULE | Freq: Three times a day (TID) | ORAL | 1 refills | Status: DC

## 2018-10-19 MED ORDER — VITAMIN D (ERGOCALCIFEROL) 1.25 MG (50000 UNIT) PO CAPS: 50000.0000 [IU] | ORAL_CAPSULE | ORAL | 0 refills | Status: DC

## 2018-10-19 MED ORDER — AMITRIPTYLINE HCL 10 MG PO TABS: 10.0000 mg | ORAL_TABLET | Freq: Every day | ORAL | 1 refills | Status: DC

## 2018-10-19 MED ORDER — VITAMIN B-12 1000 MCG PO TABS: 1000.0000 ug | ORAL_TABLET | Freq: Every day | ORAL | 1 refills | Status: DC

## 2018-10-19 MED FILL — AMITRIPTYLINE HCL 10 MG TAB: 10 | 30 days supply | Qty: 30 | Fill #0

## 2018-10-19 MED FILL — VIT D2 1.25 MG (50,000 UNIT: 1.25 MG | 56 days supply | Qty: 8 | Fill #0
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
## 2018-10-20 MED FILL — GABAPENTIN 100 MG CAPSULE: 100 | 30 days supply | Qty: 90 | Fill #0

## 2018-10-23 ENCOUNTER — Ambulatory Visit: Payer: No Typology Code available for payment source | Admitting: Registered"

## 2018-11-16 ENCOUNTER — Other Ambulatory Visit: Payer: Self-pay

## 2018-11-16 ENCOUNTER — Encounter (HOSPITAL_BASED_OUTPATIENT_CLINIC_OR_DEPARTMENT_OTHER): Payer: No Typology Code available for payment source | Admitting: Physical Medicine & Rehabilitation

## 2018-11-16 ENCOUNTER — Encounter: Payer: Self-pay | Admitting: Physical Medicine & Rehabilitation

## 2018-11-16 DIAGNOSIS — M79671 Pain in right foot: Secondary | ICD-10-CM

## 2018-11-16 DIAGNOSIS — E538 Deficiency of other specified B group vitamins: Secondary | ICD-10-CM

## 2018-11-16 DIAGNOSIS — R269 Unspecified abnormalities of gait and mobility: Secondary | ICD-10-CM

## 2018-11-16 DIAGNOSIS — E559 Vitamin D deficiency, unspecified: Secondary | ICD-10-CM

## 2018-11-16 DIAGNOSIS — M792 Neuralgia and neuritis, unspecified: Secondary | ICD-10-CM

## 2018-11-16 DIAGNOSIS — G479 Sleep disorder, unspecified: Secondary | ICD-10-CM

## 2018-11-16 MED ORDER — GABAPENTIN 100 MG PO CAPS: 200.0000 mg | ORAL_CAPSULE | Freq: Three times a day (TID) | ORAL | 1 refills | Status: DC

## 2018-11-16 MED ORDER — AMITRIPTYLINE HCL 25 MG PO TABS: 25.0000 mg | ORAL_TABLET | Freq: Every day | ORAL | 1 refills | Status: DC

## 2018-11-16 MED FILL — GABAPENTIN 100 MG CAPSULE: 100 | 30 days supply | Qty: 180 | Fill #0

## 2018-11-16 MED FILL — AMITRIPTYLINE HCL 25 MG TAB: 25 | 30 days supply | Qty: 30 | Fill #0
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
## 2018-11-27 ENCOUNTER — Ambulatory Visit: Payer: No Typology Code available for payment source | Admitting: Registered"

## 2018-11-27 MED FILL — DICLOFENAC SODIUM 1 % GEL: 1 | 12 days supply | Qty: 100 | Fill #1

## 2018-11-27 MED FILL — TRAMADOL ER 100 MG TABLET: 100 | 60 days supply | Qty: 60 | Fill #1

## 2018-12-05 ENCOUNTER — Ambulatory Visit: Payer: No Typology Code available for payment source | Admitting: Registered"

## 2018-12-14 ENCOUNTER — Encounter: Payer: No Typology Code available for payment source | Admitting: Physical Medicine & Rehabilitation

## 2018-12-15 ENCOUNTER — Encounter: Payer: No Typology Code available for payment source | Admitting: Physical Medicine & Rehabilitation

## 2018-12-26 ENCOUNTER — Other Ambulatory Visit: Payer: Self-pay

## 2018-12-26 ENCOUNTER — Ambulatory Visit (INDEPENDENT_AMBULATORY_CARE_PROVIDER_SITE_OTHER): Payer: No Typology Code available for payment source | Admitting: Family Medicine

## 2018-12-26 ENCOUNTER — Encounter: Payer: Self-pay | Admitting: Family Medicine

## 2018-12-26 DIAGNOSIS — G8929 Other chronic pain: Secondary | ICD-10-CM

## 2018-12-26 DIAGNOSIS — M25512 Pain in left shoulder: Secondary | ICD-10-CM | POA: Diagnosis not present
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
## 2019-01-02 ENCOUNTER — Ambulatory Visit: Payer: No Typology Code available for payment source | Admitting: Orthopaedic Surgery

## 2019-01-02 ENCOUNTER — Ambulatory Visit (INDEPENDENT_AMBULATORY_CARE_PROVIDER_SITE_OTHER): Payer: No Typology Code available for payment source

## 2019-01-02 ENCOUNTER — Encounter: Payer: Self-pay | Admitting: Orthopaedic Surgery

## 2019-01-02 ENCOUNTER — Other Ambulatory Visit: Payer: Self-pay

## 2019-01-02 VITALS — BP 137/89 | HR 85 | Ht 63.0 in | Wt 210.0 lb

## 2019-01-02 DIAGNOSIS — G8929 Other chronic pain: Secondary | ICD-10-CM

## 2019-01-02 DIAGNOSIS — M25512 Pain in left shoulder: Secondary | ICD-10-CM

## 2019-01-02 MED ORDER — BUPIVACAINE HCL 0.5 % IJ SOLN
2.0000 mL | INTRAMUSCULAR | Status: AC | PRN
  Administered 2019-01-02: 2 mL via INTRA_ARTICULAR

## 2019-01-02 MED ORDER — METHYLPREDNISOLONE ACETATE 40 MG/ML IJ SUSP
80.0000 mg | INTRAMUSCULAR | Status: AC | PRN
  Administered 2019-01-02: 80 mg via INTRA_ARTICULAR

## 2019-01-02 MED ORDER — LIDOCAINE HCL 2 % IJ SOLN
2.0000 mL | INTRAMUSCULAR | Status: AC | PRN
  Administered 2019-01-02: 2 mL
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
## 2019-01-05 ENCOUNTER — Other Ambulatory Visit: Payer: Self-pay

## 2019-01-05 ENCOUNTER — Encounter
Payer: No Typology Code available for payment source | Attending: Physical Medicine & Rehabilitation | Admitting: Physical Medicine & Rehabilitation

## 2019-01-05 ENCOUNTER — Encounter: Payer: Self-pay | Admitting: Physical Medicine & Rehabilitation

## 2019-01-05 ENCOUNTER — Other Ambulatory Visit: Payer: Self-pay | Admitting: Family Medicine

## 2019-01-05 ENCOUNTER — Telehealth: Payer: Self-pay | Admitting: Family Medicine

## 2019-01-05 VITALS — BP 156/95 | HR 87 | Temp 98.9°F | Ht 63.0 in | Wt 225.6 lb

## 2019-01-05 DIAGNOSIS — E559 Vitamin D deficiency, unspecified: Secondary | ICD-10-CM | POA: Diagnosis not present

## 2019-01-05 DIAGNOSIS — G8929 Other chronic pain: Secondary | ICD-10-CM

## 2019-01-05 DIAGNOSIS — E538 Deficiency of other specified B group vitamins: Secondary | ICD-10-CM

## 2019-01-05 DIAGNOSIS — Z6841 Body Mass Index (BMI) 40.0 and over, adult: Secondary | ICD-10-CM | POA: Insufficient documentation

## 2019-01-05 DIAGNOSIS — M792 Neuralgia and neuritis, unspecified: Secondary | ICD-10-CM | POA: Diagnosis not present

## 2019-01-05 DIAGNOSIS — M79671 Pain in right foot: Secondary | ICD-10-CM

## 2019-01-05 DIAGNOSIS — R269 Unspecified abnormalities of gait and mobility: Secondary | ICD-10-CM | POA: Diagnosis not present

## 2019-01-05 DIAGNOSIS — I10 Essential (primary) hypertension: Secondary | ICD-10-CM | POA: Insufficient documentation

## 2019-01-05 DIAGNOSIS — Z87891 Personal history of nicotine dependence: Secondary | ICD-10-CM | POA: Insufficient documentation

## 2019-01-05 DIAGNOSIS — G479 Sleep disorder, unspecified: Secondary | ICD-10-CM

## 2019-01-05 MED ORDER — TRAMADOL HCL ER 100 MG PO TB24: 100.0000 mg | ORAL_TABLET | Freq: Every day | ORAL | 2 refills | Status: DC | PRN

## 2019-01-05 MED ORDER — DULOXETINE HCL 30 MG PO CPEP: 30.0000 mg | ORAL_CAPSULE | Freq: Every day | ORAL | 1 refills | Status: DC

## 2019-01-05 MED ORDER — ERGOCALCIFEROL 50 MCG (2000 UT) PO CAPS: 2000.0000 [IU] | ORAL_CAPSULE | Freq: Every day | ORAL | 2 refills | Status: DC

## 2019-01-05 MED FILL — DULoxetine HCL 30 MG CPEP: 30 | 30 days supply | Qty: 30 | Fill #0

## 2019-01-05 NOTE — Telephone Encounter (Signed)
Medication Refill - Medication:   traMADol (ULTRAM-ER) 100 MG 24 hr tablet [[id number]]   Has the patient contacted their pharmacy? no  Preferred Pharmacy  [place], [place] - [place] [phone] (Phone) [phone] (Fax)
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
## 2019-01-05 NOTE — Telephone Encounter (Signed)
Refill done.  

## 2019-02-01 ENCOUNTER — Encounter
Payer: No Typology Code available for payment source | Attending: Physical Medicine & Rehabilitation | Admitting: Physical Medicine & Rehabilitation

## 2019-02-01 DIAGNOSIS — I10 Essential (primary) hypertension: Secondary | ICD-10-CM | POA: Insufficient documentation

## 2019-02-01 DIAGNOSIS — G8929 Other chronic pain: Secondary | ICD-10-CM | POA: Insufficient documentation

## 2019-02-01 DIAGNOSIS — Z87891 Personal history of nicotine dependence: Secondary | ICD-10-CM | POA: Insufficient documentation

## 2019-02-01 DIAGNOSIS — G479 Sleep disorder, unspecified: Secondary | ICD-10-CM | POA: Insufficient documentation

## 2019-02-01 DIAGNOSIS — Z6841 Body Mass Index (BMI) 40.0 and over, adult: Secondary | ICD-10-CM | POA: Insufficient documentation

## 2019-02-01 DIAGNOSIS — M79671 Pain in right foot: Secondary | ICD-10-CM | POA: Insufficient documentation

## 2019-02-01 DIAGNOSIS — R269 Unspecified abnormalities of gait and mobility: Secondary | ICD-10-CM | POA: Insufficient documentation

## 2019-02-16 ENCOUNTER — Ambulatory Visit: Payer: No Typology Code available for payment source | Admitting: Orthopaedic Surgery

## 2019-02-27 ENCOUNTER — Telehealth: Payer: Self-pay | Admitting: Family Medicine

## 2019-02-27 MED FILL — TRIAMTERENE/HCTZ 37.5/25 TB: 37.5-25 | 90 days supply | Qty: 90 | Fill #1

## 2019-02-27 MED FILL — AMITRIPTYLINE HCL 25 MG TAB: 25 | 30 days supply | Qty: 30 | Fill #1

## 2019-02-27 NOTE — Telephone Encounter (Signed)
Medication Refill - Medication: traMADol (ULTRAM-ER) 100 MG 24 hr tablet   Preferred Pharmacy (with phone number or street name):  [place], [place] - [place] [phone] (Phone) [phone] (Fax)

## 2019-02-28 NOTE — Telephone Encounter (Signed)
See note

## 2019-02-28 NOTE — Telephone Encounter (Signed)
Dr. Volanda Napoleon Please advise

## 2019-03-01 ENCOUNTER — Other Ambulatory Visit: Payer: Self-pay | Admitting: Family Medicine

## 2019-03-01 DIAGNOSIS — G8929 Other chronic pain: Secondary | ICD-10-CM

## 2019-03-01 MED ORDER — TRAMADOL HCL ER 100 MG PO TB24: 100.0000 mg | ORAL_TABLET | Freq: Every day | ORAL | 3 refills | Status: DC | PRN

## 2019-03-01 NOTE — Telephone Encounter (Signed)
Tramadol refilled.

## 2019-03-02 MED FILL — traMADol HCL ER 100 MG TB24: 100 | 60 days supply | Qty: 60 | Fill #0

## 2019-03-06 ENCOUNTER — Encounter: Payer: Self-pay | Admitting: Orthopaedic Surgery

## 2019-03-06 ENCOUNTER — Ambulatory Visit: Payer: No Typology Code available for payment source | Admitting: Orthopaedic Surgery

## 2019-03-06 ENCOUNTER — Other Ambulatory Visit: Payer: Self-pay

## 2019-03-06 DIAGNOSIS — G8929 Other chronic pain: Secondary | ICD-10-CM

## 2019-03-06 DIAGNOSIS — M25512 Pain in left shoulder: Secondary | ICD-10-CM | POA: Insufficient documentation

## 2019-03-06 MED FILL — AMLODIPINE BESYLATE 10 MG T: 10 | 90 days supply | Qty: 90 | Fill #1

## 2019-03-06 NOTE — Addendum Note (Signed)
Addended by: Lendon Collar on: 03/06/2019 08:40 AM   Modules accepted: Orders
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
## 2019-04-02 ENCOUNTER — Other Ambulatory Visit: Payer: Self-pay

## 2019-04-02 ENCOUNTER — Ambulatory Visit
Admission: RE | Admit: 2019-04-02 | Discharge: 2019-04-02 | Disposition: A | Discharge status: Home / Self Care | Payer: No Typology Code available for payment source | Source: Ambulatory Visit | Attending: Orthopaedic Surgery | Admitting: Orthopaedic Surgery

## 2019-04-02 DIAGNOSIS — M25512 Pain in left shoulder: Secondary | ICD-10-CM

## 2019-04-02 DIAGNOSIS — G8929 Other chronic pain: Secondary | ICD-10-CM

## 2019-04-05 ENCOUNTER — Ambulatory Visit: Payer: No Typology Code available for payment source | Admitting: Orthopaedic Surgery

## 2019-04-05 ENCOUNTER — Other Ambulatory Visit: Payer: Self-pay

## 2019-04-05 ENCOUNTER — Encounter: Payer: Self-pay | Admitting: Orthopaedic Surgery

## 2019-04-05 VITALS — BP 140/83 | HR 81 | Ht 63.0 in | Wt 217.0 lb

## 2019-04-05 DIAGNOSIS — M25512 Pain in left shoulder: Secondary | ICD-10-CM

## 2019-04-05 DIAGNOSIS — G8929 Other chronic pain: Secondary | ICD-10-CM
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
## 2019-04-11 ENCOUNTER — Telehealth: Payer: Self-pay | Admitting: Orthopaedic Surgery

## 2019-04-11 NOTE — Telephone Encounter (Signed)
I called patient to discuss surgery date for her left shoulder with Dr. Durward Fortes at [facility].  She states she is not ready for surgery.  She was given a card which has my direct number for scheduling.  She will call if and when she changes her mind.

## 2019-04-11 NOTE — Telephone Encounter (Signed)
FYI

## 2019-04-11 NOTE — Telephone Encounter (Signed)
thanks

## 2019-04-14 ENCOUNTER — Ambulatory Visit (INDEPENDENT_AMBULATORY_CARE_PROVIDER_SITE_OTHER): Payer: No Typology Code available for payment source

## 2019-04-14 ENCOUNTER — Other Ambulatory Visit: Payer: Self-pay

## 2019-04-14 DIAGNOSIS — Z23 Encounter for immunization: Secondary | ICD-10-CM

## 2019-04-30 ENCOUNTER — Other Ambulatory Visit: Payer: Self-pay

## 2019-04-30 ENCOUNTER — Encounter
Payer: No Typology Code available for payment source | Attending: Physical Medicine & Rehabilitation | Admitting: Physical Medicine & Rehabilitation

## 2019-04-30 ENCOUNTER — Encounter: Payer: Self-pay | Admitting: Physical Medicine & Rehabilitation

## 2019-04-30 VITALS — BP 124/79 | HR 87 | Temp 97.7°F | Ht 63.0 in | Wt 225.0 lb

## 2019-04-30 DIAGNOSIS — R29898 Other symptoms and signs involving the musculoskeletal system: Secondary | ICD-10-CM | POA: Diagnosis not present

## 2019-04-30 DIAGNOSIS — G8929 Other chronic pain: Secondary | ICD-10-CM | POA: Diagnosis not present

## 2019-04-30 DIAGNOSIS — E559 Vitamin D deficiency, unspecified: Secondary | ICD-10-CM

## 2019-04-30 DIAGNOSIS — Z87891 Personal history of nicotine dependence: Secondary | ICD-10-CM | POA: Diagnosis not present

## 2019-04-30 DIAGNOSIS — M792 Neuralgia and neuritis, unspecified: Secondary | ICD-10-CM

## 2019-04-30 DIAGNOSIS — G479 Sleep disorder, unspecified: Secondary | ICD-10-CM | POA: Diagnosis not present

## 2019-04-30 DIAGNOSIS — R269 Unspecified abnormalities of gait and mobility: Secondary | ICD-10-CM | POA: Diagnosis not present

## 2019-04-30 DIAGNOSIS — M79671 Pain in right foot: Secondary | ICD-10-CM | POA: Diagnosis not present

## 2019-04-30 DIAGNOSIS — E538 Deficiency of other specified B group vitamins: Secondary | ICD-10-CM

## 2019-04-30 DIAGNOSIS — Z6841 Body Mass Index (BMI) 40.0 and over, adult: Secondary | ICD-10-CM | POA: Diagnosis not present

## 2019-04-30 DIAGNOSIS — I10 Essential (primary) hypertension: Secondary | ICD-10-CM | POA: Insufficient documentation

## 2019-04-30 MED ORDER — DULOXETINE HCL 60 MG PO CPEP: 60.0000 mg | ORAL_CAPSULE | Freq: Every day | ORAL | 1 refills | Status: DC

## 2019-04-30 MED ORDER — GABAPENTIN 100 MG PO CAPS: 200.0000 mg | ORAL_CAPSULE | Freq: Three times a day (TID) | ORAL | 1 refills | Status: DC

## 2019-04-30 MED ORDER — AMITRIPTYLINE HCL 25 MG PO TABS: 25.0000 mg | ORAL_TABLET | Freq: Every day | ORAL | 1 refills | Status: DC

## 2019-04-30 MED FILL — GABAPENTIN 100 MG CAPSULE: 100 | 30 days supply | Qty: 180 | Fill #0

## 2019-04-30 MED FILL — AMITRIPTYLINE HCL 25 MG TAB: 25 | 30 days supply | Qty: 30 | Fill #0

## 2019-04-30 MED FILL — DULOXETINE HCL 60 MG CPEP: 60 | 30 days supply | Qty: 30 | Fill #0
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
## 2019-05-15 MED FILL — DULOXETINE HCL 60 MG CPEP: 60 | 30 days supply | Qty: 30 | Fill #0

## 2019-05-15 MED FILL — GABAPENTIN 100 MG CAPSULE: 100 | 30 days supply | Qty: 180 | Fill #0

## 2019-05-15 MED FILL — AMITRIPTYLINE HCL 25 MG TAB: 25 | 30 days supply | Qty: 30 | Fill #0

## 2019-05-24 ENCOUNTER — Other Ambulatory Visit: Payer: Self-pay

## 2019-05-24 ENCOUNTER — Ambulatory Visit (HOSPITAL_COMMUNITY)
Admission: RE | Admit: 2019-05-24 | Discharge: 2019-05-24 | Disposition: A | Discharge status: Home / Self Care | Payer: No Typology Code available for payment source | Source: Ambulatory Visit | Attending: Physical Medicine & Rehabilitation | Admitting: Physical Medicine & Rehabilitation

## 2019-05-24 DIAGNOSIS — R29898 Other symptoms and signs involving the musculoskeletal system: Secondary | ICD-10-CM | POA: Diagnosis present

## 2019-05-24 DIAGNOSIS — R269 Unspecified abnormalities of gait and mobility: Secondary | ICD-10-CM | POA: Insufficient documentation

## 2019-05-24 DIAGNOSIS — M792 Neuralgia and neuritis, unspecified: Secondary | ICD-10-CM | POA: Diagnosis not present

## 2019-05-28 ENCOUNTER — Other Ambulatory Visit: Payer: Self-pay

## 2019-05-28 ENCOUNTER — Encounter: Payer: Self-pay | Admitting: Physical Medicine & Rehabilitation

## 2019-05-28 ENCOUNTER — Ambulatory Visit: Payer: No Typology Code available for payment source | Admitting: Physical Medicine & Rehabilitation

## 2019-05-28 ENCOUNTER — Encounter
Payer: No Typology Code available for payment source | Attending: Physical Medicine & Rehabilitation | Admitting: Physical Medicine & Rehabilitation

## 2019-05-28 VITALS — BP 145/84 | HR 87 | Temp 97.7°F | Ht 63.0 in | Wt 225.0 lb

## 2019-05-28 DIAGNOSIS — R269 Unspecified abnormalities of gait and mobility: Secondary | ICD-10-CM | POA: Insufficient documentation

## 2019-05-28 DIAGNOSIS — Z87891 Personal history of nicotine dependence: Secondary | ICD-10-CM | POA: Insufficient documentation

## 2019-05-28 DIAGNOSIS — G479 Sleep disorder, unspecified: Secondary | ICD-10-CM | POA: Insufficient documentation

## 2019-05-28 DIAGNOSIS — E538 Deficiency of other specified B group vitamins: Secondary | ICD-10-CM

## 2019-05-28 DIAGNOSIS — M792 Neuralgia and neuritis, unspecified: Secondary | ICD-10-CM | POA: Diagnosis not present

## 2019-05-28 DIAGNOSIS — E559 Vitamin D deficiency, unspecified: Secondary | ICD-10-CM | POA: Insufficient documentation

## 2019-05-28 DIAGNOSIS — M47819 Spondylosis without myelopathy or radiculopathy, site unspecified: Secondary | ICD-10-CM | POA: Insufficient documentation

## 2019-05-28 DIAGNOSIS — G8929 Other chronic pain: Secondary | ICD-10-CM | POA: Diagnosis present

## 2019-05-28 DIAGNOSIS — I10 Essential (primary) hypertension: Secondary | ICD-10-CM | POA: Diagnosis not present

## 2019-05-28 DIAGNOSIS — M79671 Pain in right foot: Secondary | ICD-10-CM | POA: Diagnosis not present

## 2019-05-28 DIAGNOSIS — Z6841 Body Mass Index (BMI) 40.0 and over, adult: Secondary | ICD-10-CM | POA: Insufficient documentation

## 2019-05-28 MED ORDER — DULOXETINE HCL 60 MG PO CPEP: 60.0000 mg | ORAL_CAPSULE | Freq: Every day | ORAL | 1 refills | Status: DC
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
## 2019-06-04 MED FILL — traMADol HCL ER 100 MG TB24: 100 | 60 days supply | Qty: 60 | Fill #1

## 2019-06-11 ENCOUNTER — Ambulatory Visit: Payer: No Typology Code available for payment source | Attending: Physical Medicine & Rehabilitation

## 2019-06-11 ENCOUNTER — Other Ambulatory Visit: Payer: Self-pay

## 2019-06-11 DIAGNOSIS — M545 Low back pain, unspecified: Secondary | ICD-10-CM

## 2019-06-11 DIAGNOSIS — R262 Difficulty in walking, not elsewhere classified: Secondary | ICD-10-CM

## 2019-06-11 DIAGNOSIS — R293 Abnormal posture: Secondary | ICD-10-CM

## 2019-06-11 DIAGNOSIS — M25571 Pain in right ankle and joints of right foot: Secondary | ICD-10-CM

## 2019-06-11 DIAGNOSIS — G8929 Other chronic pain: Secondary | ICD-10-CM

## 2019-06-11 DIAGNOSIS — M25671 Stiffness of right ankle, not elsewhere classified: Secondary | ICD-10-CM

## 2019-06-11 MED FILL — DULOXETINE HCL 60 MG CPEP: 60 | 30 days supply | Qty: 30 | Fill #1

## 2019-06-11 NOTE — Patient Instructions (Signed)
Strap DF stretch 30 sec x 2-3 reps 1-2x/day RT,  Knee to chest and LTR x 2-10 reps 5 -03 sec stretch 1-2x/day
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
## 2019-06-12 LAB — VITAMIN D 25 HYDROXY (VIT D DEFICIENCY, FRACTURES): Vit D, 25-Hydroxy: 21.3 ng/mL — ABNORMAL LOW (ref 30.0–100.0)

## 2019-06-12 LAB — VITAMIN B12: Vitamin B-12: 1514 pg/mL — ABNORMAL HIGH (ref 232–1245)

## 2019-06-19 ENCOUNTER — Other Ambulatory Visit: Payer: Self-pay

## 2019-06-19 ENCOUNTER — Ambulatory Visit: Payer: No Typology Code available for payment source | Attending: Physical Medicine & Rehabilitation

## 2019-06-19 DIAGNOSIS — M545 Low back pain: Secondary | ICD-10-CM | POA: Insufficient documentation

## 2019-06-19 DIAGNOSIS — M25671 Stiffness of right ankle, not elsewhere classified: Secondary | ICD-10-CM | POA: Diagnosis not present

## 2019-06-19 DIAGNOSIS — R293 Abnormal posture: Secondary | ICD-10-CM

## 2019-06-19 DIAGNOSIS — R262 Difficulty in walking, not elsewhere classified: Secondary | ICD-10-CM

## 2019-06-19 DIAGNOSIS — M25571 Pain in right ankle and joints of right foot: Secondary | ICD-10-CM

## 2019-06-19 DIAGNOSIS — G8929 Other chronic pain: Secondary | ICD-10-CM | POA: Diagnosis present
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
## 2019-07-02 ENCOUNTER — Other Ambulatory Visit: Payer: Self-pay

## 2019-07-02 ENCOUNTER — Ambulatory Visit: Payer: No Typology Code available for payment source

## 2019-07-02 ENCOUNTER — Telehealth: Payer: Self-pay | Admitting: Physical Therapy

## 2019-07-02 NOTE — Telephone Encounter (Signed)
Message left about today's appointment and that she has appointment 07/03/19 at 7:45 AM.

## 2019-07-03 ENCOUNTER — Ambulatory Visit: Payer: No Typology Code available for payment source

## 2019-07-10 ENCOUNTER — Encounter: Payer: 59 | Admitting: Physical Medicine & Rehabilitation

## 2019-07-24 ENCOUNTER — Other Ambulatory Visit: Payer: Self-pay | Admitting: Family Medicine

## 2019-07-24 ENCOUNTER — Encounter: Payer: 59 | Attending: Physical Medicine & Rehabilitation | Admitting: Physical Medicine & Rehabilitation

## 2019-07-24 ENCOUNTER — Encounter: Payer: Self-pay | Admitting: Physical Medicine & Rehabilitation

## 2019-07-24 ENCOUNTER — Other Ambulatory Visit: Payer: Self-pay

## 2019-07-24 VITALS — BP 144/84 | HR 80 | Temp 97.8°F | Ht 63.0 in | Wt 236.0 lb

## 2019-07-24 DIAGNOSIS — M545 Low back pain, unspecified: Secondary | ICD-10-CM

## 2019-07-24 DIAGNOSIS — M47819 Spondylosis without myelopathy or radiculopathy, site unspecified: Secondary | ICD-10-CM | POA: Insufficient documentation

## 2019-07-24 DIAGNOSIS — E538 Deficiency of other specified B group vitamins: Secondary | ICD-10-CM | POA: Diagnosis not present

## 2019-07-24 DIAGNOSIS — R0683 Snoring: Secondary | ICD-10-CM | POA: Diagnosis not present

## 2019-07-24 DIAGNOSIS — M79671 Pain in right foot: Secondary | ICD-10-CM | POA: Insufficient documentation

## 2019-07-24 DIAGNOSIS — G8929 Other chronic pain: Secondary | ICD-10-CM | POA: Diagnosis not present

## 2019-07-24 DIAGNOSIS — Z87891 Personal history of nicotine dependence: Secondary | ICD-10-CM | POA: Insufficient documentation

## 2019-07-24 DIAGNOSIS — I10 Essential (primary) hypertension: Secondary | ICD-10-CM

## 2019-07-24 DIAGNOSIS — G479 Sleep disorder, unspecified: Secondary | ICD-10-CM | POA: Insufficient documentation

## 2019-07-24 DIAGNOSIS — Z6841 Body Mass Index (BMI) 40.0 and over, adult: Secondary | ICD-10-CM | POA: Diagnosis not present

## 2019-07-24 DIAGNOSIS — R269 Unspecified abnormalities of gait and mobility: Secondary | ICD-10-CM | POA: Insufficient documentation

## 2019-07-24 DIAGNOSIS — E559 Vitamin D deficiency, unspecified: Secondary | ICD-10-CM | POA: Insufficient documentation

## 2019-07-24 MED ORDER — METHOCARBAMOL 500 MG PO TABS: 500.0000 mg | ORAL_TABLET | Freq: Three times a day (TID) | ORAL | 1 refills | Status: DC | PRN

## 2019-07-24 MED ORDER — AMITRIPTYLINE HCL 50 MG PO TABS: 50.0000 mg | ORAL_TABLET | Freq: Every day | ORAL | 1 refills | Status: DC

## 2019-07-24 MED ORDER — DULOXETINE HCL 30 MG PO CPEP: 30.0000 mg | ORAL_CAPSULE | Freq: Every day | ORAL | 0 refills | Status: DC

## 2019-07-24 MED FILL — METHOCARBAMOL 500 MG TABS: 500 | 20 days supply | Qty: 60 | Fill #0

## 2019-07-24 MED FILL — AMITRIPTYLINE HCL 50 MG TAB: 50 | 30 days supply | Qty: 30 | Fill #0

## 2019-07-24 MED FILL — AMLODIPINE BESYLATE 10 MG T: 10 | 90 days supply | Qty: 90 | Fill #2

## 2019-07-24 MED FILL — traMADol HCL ER 100 MG TB24: 100 | 60 days supply | Qty: 60 | Fill #2

## 2019-07-24 MED FILL — DULoxetine HCL 30 MG CPEP: 30 | 30 days supply | Qty: 30 | Fill #0
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
## 2019-07-25 MED FILL — TRIAMTERENE-HCTZ 37.5-25 MG: 37.5-25 | 90 days supply | Qty: 90 | Fill #0

## 2019-08-07 ENCOUNTER — Other Ambulatory Visit: Payer: Self-pay

## 2019-08-07 ENCOUNTER — Ambulatory Visit: Payer: 59 | Attending: Physical Medicine & Rehabilitation

## 2019-08-07 DIAGNOSIS — M545 Low back pain: Secondary | ICD-10-CM | POA: Insufficient documentation

## 2019-08-07 DIAGNOSIS — R293 Abnormal posture: Secondary | ICD-10-CM | POA: Insufficient documentation

## 2019-08-07 DIAGNOSIS — M25671 Stiffness of right ankle, not elsewhere classified: Secondary | ICD-10-CM | POA: Insufficient documentation

## 2019-08-07 DIAGNOSIS — R262 Difficulty in walking, not elsewhere classified: Secondary | ICD-10-CM | POA: Diagnosis not present

## 2019-08-07 DIAGNOSIS — M25571 Pain in right ankle and joints of right foot: Secondary | ICD-10-CM | POA: Diagnosis not present

## 2019-08-07 DIAGNOSIS — G8929 Other chronic pain: Secondary | ICD-10-CM | POA: Insufficient documentation
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
## 2019-08-07 NOTE — Patient Instructions (Signed)
Single knee to chest , lower trunk rotation x 3 RT and LT 30 sec hold 2x/day   PPT x 10 5 sec hold 2x/day

## 2019-08-08 ENCOUNTER — Encounter: Payer: Self-pay | Admitting: Family Medicine

## 2019-08-08 ENCOUNTER — Ambulatory Visit: Payer: 59 | Admitting: Family Medicine

## 2019-08-08 VITALS — BP 132/80 | HR 84 | Temp 97.9°F | Wt 230.0 lb

## 2019-08-08 DIAGNOSIS — H6121 Impacted cerumen, right ear: Secondary | ICD-10-CM

## 2019-08-08 DIAGNOSIS — R0683 Snoring: Secondary | ICD-10-CM | POA: Diagnosis not present

## 2019-08-08 NOTE — Patient Instructions (Signed)
Screening for Sleep Apnea  Sleep apnea is a condition in which breathing pauses or becomes shallow during sleep. Sleep apnea screening is a test to determine if you are at risk for sleep apnea. The test is easy and only takes a few minutes. Your health care provider may ask you to have this test in preparation for surgery or as part of a physical exam. What are the symptoms of sleep apnea? Common symptoms of sleep apnea include:  Snoring.  Restless sleep.  Daytime sleepiness.  Pauses in breathing.  Choking during sleep.  Irritability.  Forgetfulness.  Trouble thinking clearly.  Depression.  Personality changes. Most people with sleep apnea are not aware that they have it. Why should I get screened? Getting screened for sleep apnea can help:  Ensure your safety. It is important for your health care providers to know whether or not you have sleep apnea, especially if you are having surgery or have other long-term (chronic) health conditions.  Improve your health and allow you to get a better night's rest. Restful sleep can help you: ? Have more energy. ? Lose weight. ? Improve high blood pressure. ? Improve diabetes management. ? Prevent stroke. ? Prevent car accidents. How is screening done? Screening usually includes being asked a list of questions about your sleep quality. Some questions you may be asked include:  Do you snore?  Is your sleep restless?  Do you have daytime sleepiness?  Has a partner or spouse told you that you stop breathing during sleep?  Have you had trouble concentrating or memory loss? If your screening test is positive, you are at risk for the condition. Further testing may be needed to confirm a diagnosis of sleep apnea. Where to find more information You can find screening tools online or at your health care clinic. For more information about sleep apnea screening and healthy sleep, visit these websites:  Centers for Disease Control and  Prevention: LearningDermat[employer].pl  American Sleep Apnea Association: www.sleepapnea.org Contact a health care provider if:  You think that you may have sleep apnea. Summary  Sleep apnea screening can help determine if you are at risk for sleep apnea.  It is important for your health care providers to know whether or not you have sleep apnea, especially if you are having surgery or have other chronic health conditions.  You may be asked to take a screening test for sleep apnea in preparation for surgery or as part of a physical exam. This information is not intended to replace advice given to you by your health care provider. Make sure you discuss any questions you have with your health care provider. Document Revised: 04/21/2018 Document Reviewed: 10/15/2016 Elsevier Patient Education  [place], Adult The ears produce a substance called earwax that helps keep bacteria out of the ear and protects the skin in the ear canal. Occasionally, earwax can build up in the ear and cause discomfort or hearing loss. What increases the risk? This condition is more likely to develop in people who:  Are female.  Are elderly.  Naturally produce more earwax.  Clean their ears often with cotton swabs.  Use earplugs often.  Use in-ear headphones often.  Wear hearing aids.  Have narrow ear canals.  Have earwax that is overly thick or sticky.  Have eczema.  Are dehydrated.  Have excess hair in the ear canal. What are the signs or symptoms? Symptoms of this condition include:  Reduced or muffled hearing.  A  feeling of fullness in the ear or feeling that the ear is plugged.  Fluid coming from the ear.  Ear pain.  Ear itch.  Ringing in the ear.  Coughing.  An obvious piece of earwax that can be seen inside the ear canal. How is this diagnosed? This condition may be diagnosed based on:  Your symptoms.  Your medical history.  An ear exam.  During the exam, your health care provider will look into your ear with an instrument called an otoscope. You may have tests, including a hearing test. How is this treated? This condition may be treated by:  Using ear drops to soften the earwax.  Having the earwax removed by a health care provider. The health care provider may: ? Flush the ear with water. ? Use an instrument that has a loop on the end (curette). ? Use a suction device.  Surgery to remove the wax buildup. This may be done in severe cases. Follow these instructions at home:   Take over-the-counter and prescription medicines only as told by your health care provider.  Do not put any objects, including cotton swabs, into your ear. You can clean the opening of your ear canal with a washcloth or facial tissue.  Follow instructions from your health care provider about cleaning your ears. Do not over-clean your ears.  Drink enough fluid to keep your urine clear or pale yellow. This will help to thin the earwax.  Keep all follow-up visits as told by your health care provider. If earwax builds up in your ears often or if you use hearing aids, consider seeing your health care provider for routine, preventive ear cleanings. Ask your health care provider how often you should schedule your cleanings.  If you have hearing aids, clean them according to instructions from the manufacturer and your health care provider. Contact a health care provider if:  You have ear pain.  You develop a fever.  You have blood, pus, or other fluid coming from your ear.  You have hearing loss.  You have ringing in your ears that does not go away.  Your symptoms do not improve with treatment.  You feel like the room is spinning (vertigo). Summary  Earwax can build up in the ear and cause discomfort or hearing loss.  The most common symptoms of this condition include reduced or muffled hearing and a feeling of fullness in the ear or feeling  that the ear is plugged.  This condition may be diagnosed based on your symptoms, your medical history, and an ear exam.  This condition may be treated by using ear drops to soften the earwax or by having the earwax removed by a health care provider.  Do not put any objects, including cotton swabs, into your ear. You can clean the opening of your ear canal with a washcloth or facial tissue. This information is not intended to replace advice given to you by your health care provider. Make sure you discuss any questions you have with your health care provider. Document Revised: 06/17/2017 Document Reviewed: 09/15/2016 Elsevier Patient Education  2020 [employer].
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
## 2019-08-15 ENCOUNTER — Other Ambulatory Visit: Payer: Self-pay

## 2019-08-15 ENCOUNTER — Encounter: Payer: Self-pay | Admitting: Physical Therapy

## 2019-08-15 ENCOUNTER — Ambulatory Visit: Payer: 59 | Admitting: Physical Therapy

## 2019-08-15 DIAGNOSIS — R293 Abnormal posture: Secondary | ICD-10-CM | POA: Diagnosis not present

## 2019-08-15 DIAGNOSIS — M25671 Stiffness of right ankle, not elsewhere classified: Secondary | ICD-10-CM | POA: Diagnosis not present

## 2019-08-15 DIAGNOSIS — R262 Difficulty in walking, not elsewhere classified: Secondary | ICD-10-CM | POA: Diagnosis not present

## 2019-08-15 DIAGNOSIS — M25571 Pain in right ankle and joints of right foot: Secondary | ICD-10-CM

## 2019-08-15 DIAGNOSIS — M545 Low back pain, unspecified: Secondary | ICD-10-CM

## 2019-08-15 DIAGNOSIS — G8929 Other chronic pain: Secondary | ICD-10-CM

## 2019-08-15 NOTE — Patient Instructions (Addendum)
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
## 2019-08-17 ENCOUNTER — Ambulatory Visit: Payer: 59 | Admitting: Physical Therapy

## 2019-08-17 ENCOUNTER — Encounter: Payer: Self-pay | Admitting: Physical Therapy

## 2019-08-17 ENCOUNTER — Other Ambulatory Visit: Payer: Self-pay

## 2019-08-17 DIAGNOSIS — M25671 Stiffness of right ankle, not elsewhere classified: Secondary | ICD-10-CM

## 2019-08-17 DIAGNOSIS — M545 Low back pain, unspecified: Secondary | ICD-10-CM

## 2019-08-17 DIAGNOSIS — R262 Difficulty in walking, not elsewhere classified: Secondary | ICD-10-CM

## 2019-08-17 DIAGNOSIS — M25571 Pain in right ankle and joints of right foot: Secondary | ICD-10-CM

## 2019-08-17 DIAGNOSIS — G8929 Other chronic pain: Secondary | ICD-10-CM | POA: Diagnosis not present

## 2019-08-17 DIAGNOSIS — R293 Abnormal posture: Secondary | ICD-10-CM
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
## 2019-08-20 ENCOUNTER — Telehealth: Payer: Self-pay | Admitting: Physical Therapy

## 2019-08-20 ENCOUNTER — Ambulatory Visit: Payer: 59 | Attending: Physical Medicine & Rehabilitation
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
## 2019-08-23 ENCOUNTER — Encounter: Payer: 59 | Attending: Physical Medicine & Rehabilitation | Admitting: Physical Medicine & Rehabilitation

## 2019-08-23 DIAGNOSIS — R269 Unspecified abnormalities of gait and mobility: Secondary | ICD-10-CM | POA: Insufficient documentation

## 2019-08-23 DIAGNOSIS — Z87891 Personal history of nicotine dependence: Secondary | ICD-10-CM | POA: Insufficient documentation

## 2019-08-23 DIAGNOSIS — G479 Sleep disorder, unspecified: Secondary | ICD-10-CM | POA: Insufficient documentation

## 2019-08-23 DIAGNOSIS — Z6841 Body Mass Index (BMI) 40.0 and over, adult: Secondary | ICD-10-CM | POA: Insufficient documentation

## 2019-08-23 DIAGNOSIS — M47819 Spondylosis without myelopathy or radiculopathy, site unspecified: Secondary | ICD-10-CM | POA: Insufficient documentation

## 2019-08-23 DIAGNOSIS — I10 Essential (primary) hypertension: Secondary | ICD-10-CM | POA: Insufficient documentation

## 2019-08-23 DIAGNOSIS — M79671 Pain in right foot: Secondary | ICD-10-CM | POA: Insufficient documentation

## 2019-08-23 DIAGNOSIS — G8929 Other chronic pain: Secondary | ICD-10-CM | POA: Insufficient documentation

## 2019-08-23 DIAGNOSIS — E559 Vitamin D deficiency, unspecified: Secondary | ICD-10-CM | POA: Insufficient documentation

## 2019-08-23 DIAGNOSIS — M545 Low back pain: Secondary | ICD-10-CM | POA: Insufficient documentation

## 2019-08-24 ENCOUNTER — Ambulatory Visit: Payer: 59 | Admitting: Physical Therapy

## 2019-10-12 DIAGNOSIS — H52223 Regular astigmatism, bilateral: Secondary | ICD-10-CM | POA: Diagnosis not present

## 2019-10-12 DIAGNOSIS — H5213 Myopia, bilateral: Secondary | ICD-10-CM | POA: Diagnosis not present

## 2019-10-12 DIAGNOSIS — H524 Presbyopia: Secondary | ICD-10-CM | POA: Diagnosis not present

## 2019-10-12 DIAGNOSIS — Z135 Encounter for screening for eye and ear disorders: Secondary | ICD-10-CM | POA: Diagnosis not present

## 2019-10-19 ENCOUNTER — Other Ambulatory Visit: Payer: Self-pay | Admitting: Family Medicine

## 2019-10-19 DIAGNOSIS — G8929 Other chronic pain: Secondary | ICD-10-CM

## 2019-10-22 NOTE — Telephone Encounter (Signed)
Pt LOV was on 08/08/2019 and last refill was 03/01/2019  for 60 tablets with 3 refills, please  advise

## 2019-10-24 ENCOUNTER — Other Ambulatory Visit: Payer: Self-pay

## 2019-11-01 ENCOUNTER — Other Ambulatory Visit: Payer: Self-pay | Admitting: Family Medicine

## 2019-11-01 DIAGNOSIS — I10 Essential (primary) hypertension: Secondary | ICD-10-CM

## 2019-11-01 MED FILL — AMLODIPINE BESYLATE 10 MG T: 10 | 90 days supply | Qty: 90 | Fill #0

## 2019-11-01 MED FILL — TRIAMTERENE-HCTZ 37.5-25 MG: 37.5-25 | 90 days supply | Qty: 90 | Fill #1

## 2019-11-01 NOTE — Telephone Encounter (Signed)
Rx for Triamterene and Tramadol prescriptions are refill too soon, not due for refill

## 2019-11-01 NOTE — Telephone Encounter (Signed)
Medication Refill:  Amlodipine Tramadol Triamterene-hydrochlorothiazide  Pharmacy: [facility] FAX: [phone]

## 2019-11-07 ENCOUNTER — Telehealth: Payer: Self-pay

## 2019-11-07 NOTE — Telephone Encounter (Signed)
Pt Prior Authorization for Tramadol was submitted to [employer] for approval

## 2019-11-13 ENCOUNTER — Telehealth: Payer: Self-pay | Admitting: Family Medicine

## 2019-11-13 NOTE — Telephone Encounter (Signed)
Bryson from [employer] would like a call back about prior authorization for pt. [id number]

## 2019-11-14 NOTE — Telephone Encounter (Signed)
Attempted to contact Verlin Fester with [employer] regarding pt Prior Auth for her Tramadol, on hold for long with no success of speaking with anyone, will try again

## 2019-11-15 MED FILL — TRIAMTERENE-HCTZ 37.5-25 MG: 37.5-25 | 90 days supply | Qty: 90 | Fill #1

## 2019-11-15 MED FILL — AMLODIPINE BESYLATE 10 MG T: 10 | 90 days supply | Qty: 90 | Fill #0

## 2019-11-23 NOTE — Telephone Encounter (Signed)
Spoke with [employer] regarding pt Prior Auth, agent state that I was denied and she was faxing a copy of the denial letter to our office for review, awaiting denial fax

## 2019-11-28 NOTE — Telephone Encounter (Signed)
Spoke with advised that Prior Auth for her Tramadol prescription was denied by insurance, state the reason was that there was no long term need for the Rx, pt state she will pay out of pocket for the cost of prescription

## 2019-12-17 IMAGING — CR DG LUMBAR SPINE COMPLETE 4+V
5 series · 5 of 5 positions shown · non-contrast
Comparison: None.

CLINICAL DATA: 48-year-old female with right lower extremity
weakness

EXAM:
LUMBAR SPINE - COMPLETE 4+ VIEW

[t l-spine a.p.]
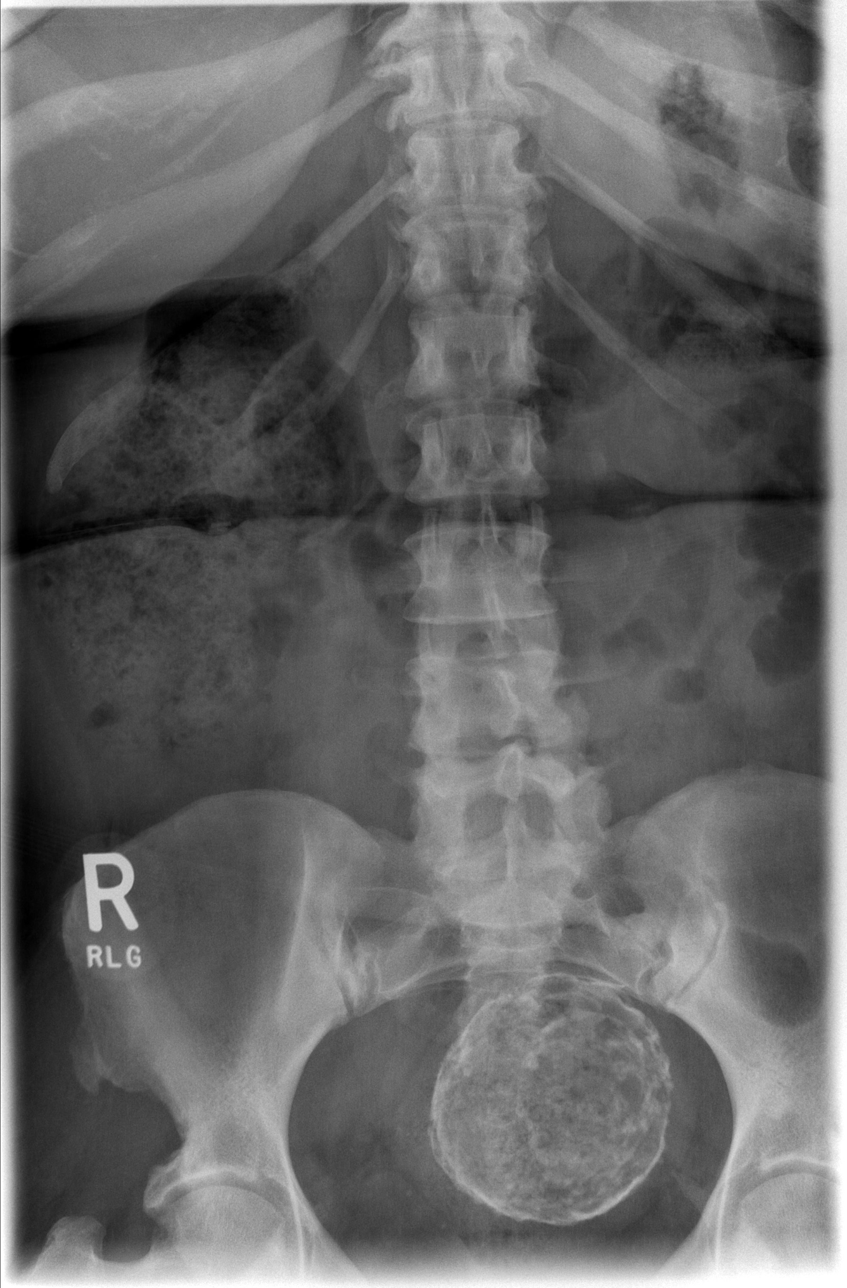

[t l-spine oblique exposure (1 of 2)]
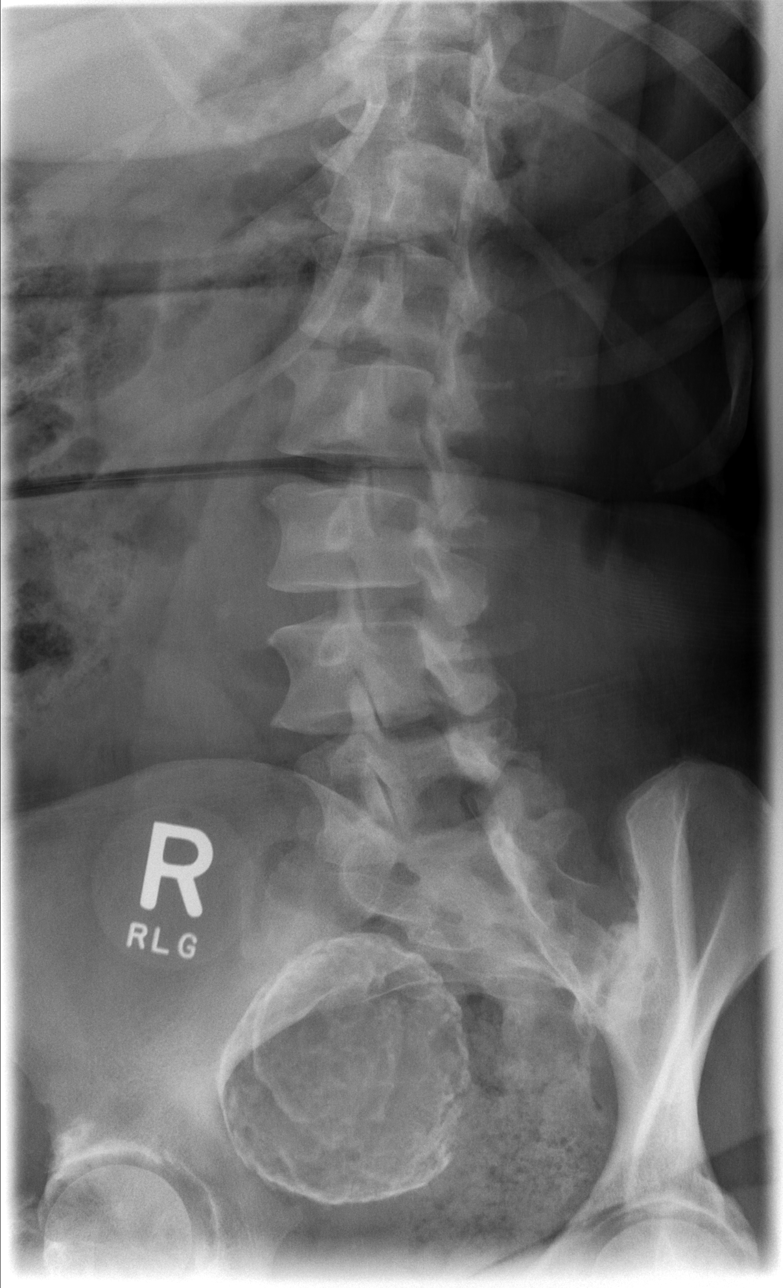

[t l-spine oblique exposure (2 of 2)]
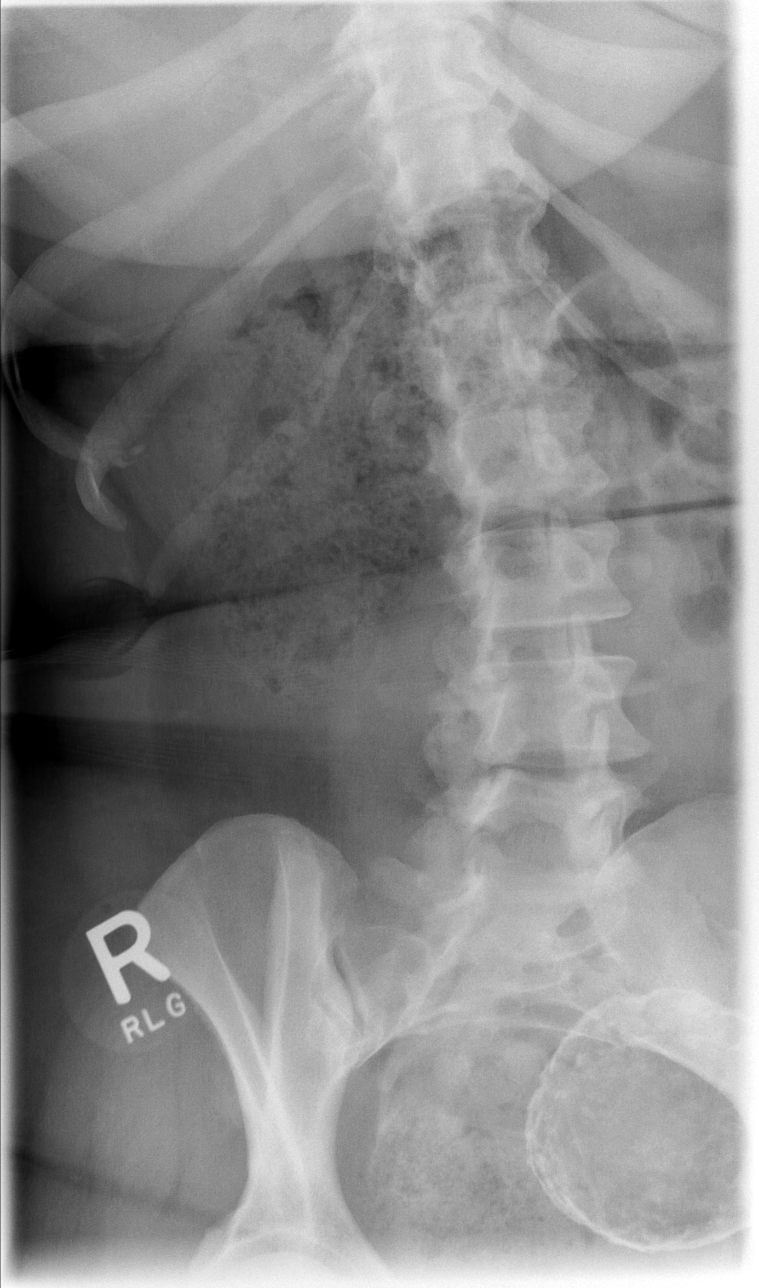

[t l-spine lat]
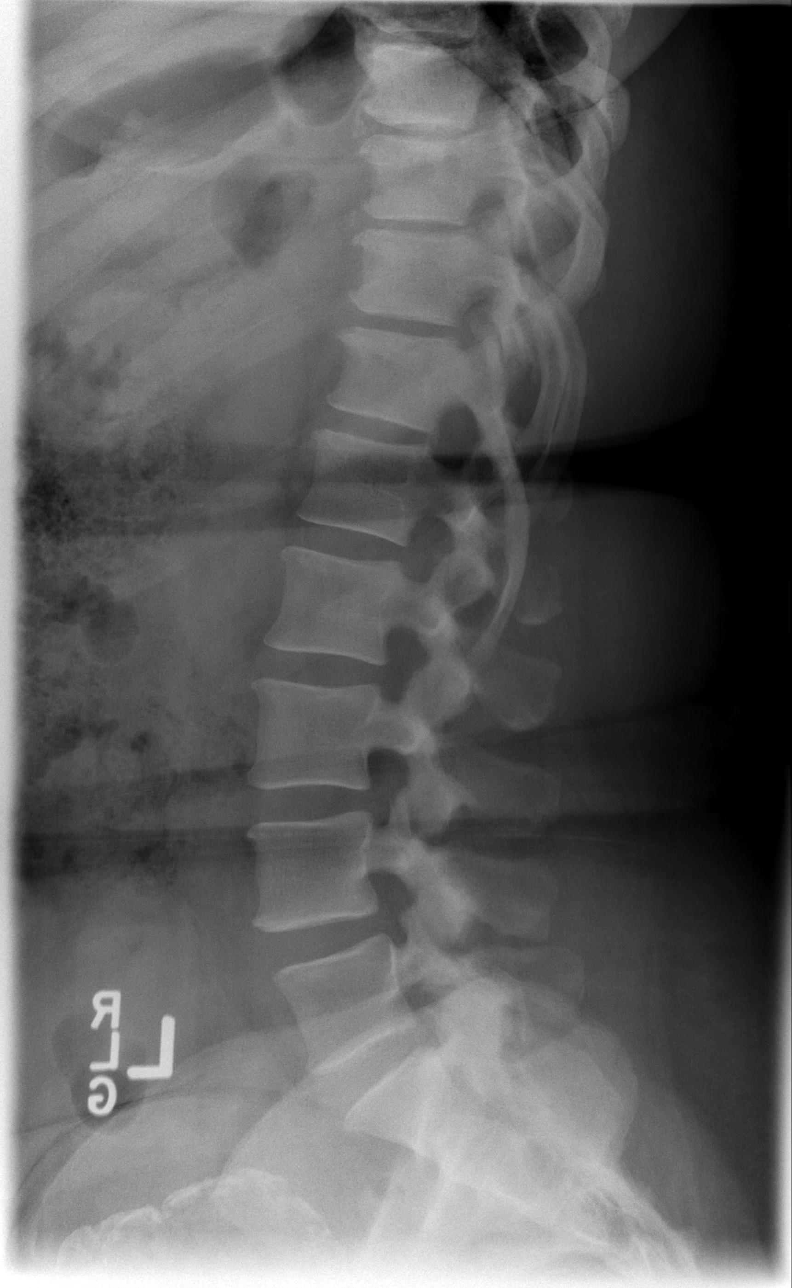

[t l-spine l5-s1 spot]
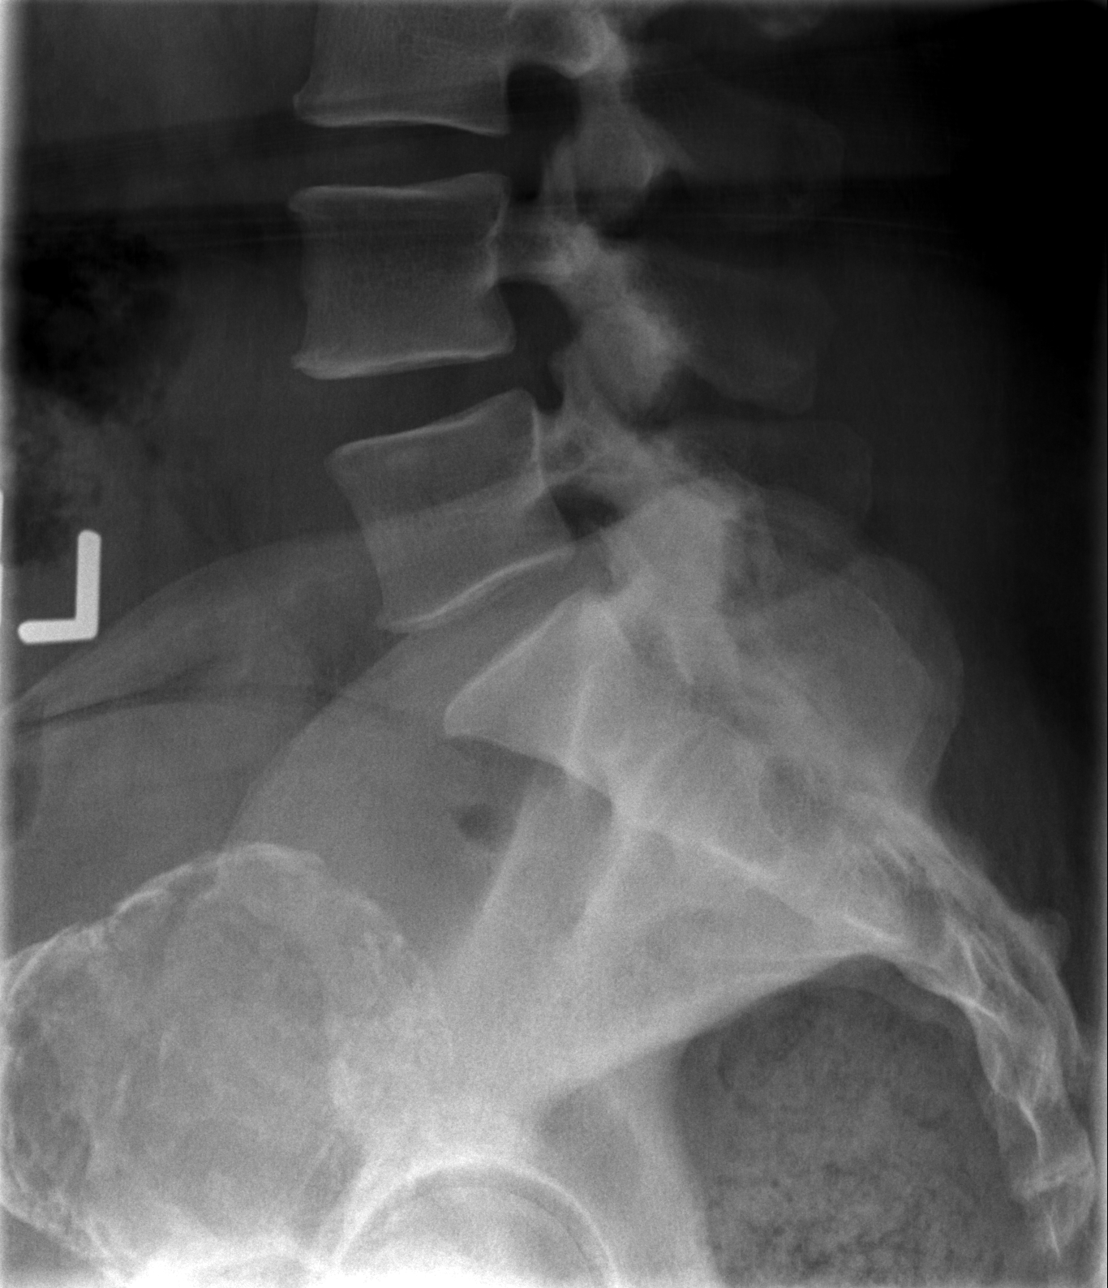

[5 of 5 positions shown; findings below may reference images not displayed]

FINDINGS: Lumbar Spine:

Lumbar vertebral elements maintain normal alignment without evidence
of anterolisthesis, retrolisthesis, subluxation.

No acute fracture line identified.

Vertebral body heights maintained.

Disc space heights maintained with no significant degenerative disc
disease or endplate changes.

Oblique images demonstrate no displaced pars defect. Early facet
disease of L5-S1.

Calcified fibroids.
IMPRESSION: Negative for acute fracture or malalignment of the lumbar spine.

Early facet disease of L5-S1.

Calcified fibroids

## 2019-12-20 ENCOUNTER — Other Ambulatory Visit: Payer: Self-pay

## 2019-12-21 ENCOUNTER — Ambulatory Visit: Payer: 59 | Admitting: Internal Medicine

## 2019-12-21 DIAGNOSIS — Z0289 Encounter for other administrative examinations: Secondary | ICD-10-CM

## 2019-12-24 ENCOUNTER — Institutional Professional Consult (permissible substitution): Payer: 59 | Admitting: Internal Medicine
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
## 2020-02-16 ENCOUNTER — Other Ambulatory Visit: Payer: Self-pay

## 2020-02-16 ENCOUNTER — Encounter (HOSPITAL_COMMUNITY): Payer: Self-pay | Admitting: Emergency Medicine

## 2020-02-16 DIAGNOSIS — D649 Anemia, unspecified: Secondary | ICD-10-CM | POA: Diagnosis not present

## 2020-02-16 DIAGNOSIS — I10 Essential (primary) hypertension: Secondary | ICD-10-CM | POA: Diagnosis not present

## 2020-02-16 DIAGNOSIS — R42 Dizziness and giddiness: Secondary | ICD-10-CM | POA: Diagnosis not present

## 2020-02-16 DIAGNOSIS — Z79899 Other long term (current) drug therapy: Secondary | ICD-10-CM | POA: Insufficient documentation

## 2020-02-16 LAB — CBC
HCT: 28.3 % — ABNORMAL LOW (ref 36.0–46.0)
Hemoglobin: 8 g/dL — ABNORMAL LOW (ref 12.0–15.0)
MCH: 19.2 pg — ABNORMAL LOW (ref 26.0–34.0)
MCHC: 28.3 g/dL — ABNORMAL LOW (ref 30.0–36.0)
MCV: 68 fL — ABNORMAL LOW (ref 80.0–100.0)
Platelets: 478 10*3/uL — ABNORMAL HIGH (ref 150–400)
RBC: 4.16 MIL/uL (ref 3.87–5.11)
RDW: 17.4 % — ABNORMAL HIGH (ref 11.5–15.5)
WBC: 9.6 10*3/uL (ref 4.0–10.5)
nRBC: 0 % (ref 0.0–0.2)

## 2020-02-16 LAB — BASIC METABOLIC PANEL
Anion gap: 12 (ref 5–15)
BUN: 10 mg/dL (ref 6–20)
CO2: 26 mmol/L (ref 22–32)
Calcium: 8.8 mg/dL — ABNORMAL LOW (ref 8.9–10.3)
Chloride: 101 mmol/L (ref 98–111)
Creatinine, Ser: 0.83 mg/dL (ref 0.44–1.00)
GFR calc Af Amer: >60 mL/min (ref >60)
GFR calc non Af Amer: >60 mL/min (ref >60)
Glucose, Bld: 99 mg/dL (ref 70–99)
Potassium: 3.3 mmol/L — ABNORMAL LOW (ref 3.5–5.1)
Sodium: 139 mmol/L (ref 135–145)

## 2020-02-16 LAB — HCG, QUANTITATIVE, PREGNANCY: hCG, Beta Chain, Quant, S: <1 m[IU]/mL (ref <5)

## 2020-02-16 LAB — CBG MONITORING, ED: Glucose-Capillary: 141 mg/dL — ABNORMAL HIGH (ref 70–99)

## 2020-02-16 MED ORDER — SODIUM CHLORIDE 0.9% FLUSH: 3.0000 mL | Freq: Once | INTRAVENOUS | Status: DC

## 2020-02-16 NOTE — ED Triage Notes (Signed)
Patient complaining of being dizziness. Patient states that she has been dizzy for a week. Patient states she started getting dizzy when she started her period. Patient states it has been getting worse.

## 2020-02-17 ENCOUNTER — Emergency Department (HOSPITAL_COMMUNITY)
Admission: EM | Admit: 2020-02-17 | Discharge: 2020-02-17 | Disposition: A | Discharge status: Home / Self Care | Payer: 59 | Attending: Emergency Medicine | Admitting: Emergency Medicine

## 2020-02-17 DIAGNOSIS — R42 Dizziness and giddiness: Secondary | ICD-10-CM

## 2020-02-17 DIAGNOSIS — D649 Anemia, unspecified: Secondary | ICD-10-CM

## 2020-02-17 MED ORDER — FERROUS SULFATE 325 (65 FE) MG PO TABS: 325.0000 mg | ORAL_TABLET | Freq: Every day | ORAL | 0 refills | Status: AC
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
## 2020-02-17 NOTE — Discharge Instructions (Signed)
You dizziness is thought to be due to anemia (low blood hemoglobin), likely caused by heavy menstrual cycles.  You should take an iron supplement and see you doctor.  Tonight your hemoglobin is 8.0.  If your symptoms change or worsen, you should return to the ER.

## 2020-02-20 ENCOUNTER — Inpatient Hospital Stay: Payer: 59 | Admitting: Family Medicine

## 2020-02-25 ENCOUNTER — Encounter: Payer: Self-pay | Admitting: Adult Health

## 2020-02-25 ENCOUNTER — Ambulatory Visit: Payer: 59 | Admitting: Adult Health

## 2020-02-25 VITALS — BP 149/86 | HR 85 | Ht 63.0 in | Wt 234.0 lb

## 2020-02-25 DIAGNOSIS — R232 Flushing: Secondary | ICD-10-CM

## 2020-02-25 DIAGNOSIS — R61 Generalized hyperhidrosis: Secondary | ICD-10-CM | POA: Diagnosis not present

## 2020-02-25 DIAGNOSIS — N920 Excessive and frequent menstruation with regular cycle: Secondary | ICD-10-CM | POA: Diagnosis not present

## 2020-02-25 DIAGNOSIS — N946 Dysmenorrhea, unspecified: Secondary | ICD-10-CM | POA: Insufficient documentation

## 2020-02-25 DIAGNOSIS — D509 Iron deficiency anemia, unspecified: Secondary | ICD-10-CM | POA: Diagnosis not present

## 2020-02-25 DIAGNOSIS — N951 Menopausal and female climacteric states: Secondary | ICD-10-CM | POA: Diagnosis not present

## 2020-02-25 NOTE — Progress Notes (Signed)
  Subjective:     Patient ID: Mia Carlson, female   DOB: [birth year], 49 y.o.   MRN: [id number]  HPI Mia Carlson is a 49 year old black female, divorced, [id number], sp tubal, in complaining of heavy periods for last 8 months or so. Periods last 7 days with 5 heavy days, will change pad every 2 hours and has bad cramps. She is also having hot flashes and night sweats, not sleeping well and moody at times.  She was seen in ER 02/16/20 for dizziness, HCG ws negative and HGB was 8, on iron, now and has history of low HGB. She works in Water engineer at [employer], and lives in [place].  PCP is Dr Volanda Napoleon.  Review of Systems  +heavy periods +bad cramps Has had dizziness in last 2 weeks    Reviewed past medical,surgical, social and family history. Reviewed medications and allergies.     Objective:   Physical Exam BP (!) 149/86 (BP Location: Left Arm, Patient Position: Sitting, Cuff Size: Large)   Pulse 85   Ht 5\' 3"  (1.6 m)   Wt 234 lb (106.1 kg)   LMP 02/11/2020   BMI 41.45 kg/m Skin warm and dry.Pelvic: external genitalia is normal in appearance no lesions, vagina: pink with good moisture and rugae,urethra has no lesions or masses noted, cervix:smooth and bulbous, uterus: normal size, shape and contour, non tender, no masses felt, adnexa: no masses or tenderness noted. Bladder is non tender and no masses felt.  Fall risk is low  Upstream - 02/25/20 1548      Pregnancy Intention Screening   Does the patient want to become pregnant in the next year? No    Does the patient's partner want to become pregnant in the next year? No    Would the patient like to discuss contraceptive options today? No      Contraception Wrap Up   Current Method Female Sterilization    End Method Female Sterilization    Contraception Counseling Provided No          Examination chaperoned by Levy Pupa LPN    Assessment:     1. Menorrhagia with regular cycle Will get GYN [place] in 1  week to assess uterus and ovaries If normal will try megace or IUD  2. Dysmenorrhea   3. Hot flashes   4. Night sweats   5. Perimenopausal Discussed perimenopause symptoms   6. Chronic iron deficiency anemia -continue iron    Plan:     Will talk when [place] results back

## 2020-02-27 ENCOUNTER — Ambulatory Visit: Payer: 59 | Admitting: Family Medicine

## 2020-02-27 ENCOUNTER — Encounter: Payer: Self-pay | Admitting: Family Medicine

## 2020-02-27 ENCOUNTER — Other Ambulatory Visit: Payer: Self-pay

## 2020-02-27 VITALS — BP 128/80 | HR 72 | Temp 98.1°F | Wt 237.0 lb

## 2020-02-27 DIAGNOSIS — D509 Iron deficiency anemia, unspecified: Secondary | ICD-10-CM

## 2020-02-27 DIAGNOSIS — E876 Hypokalemia: Secondary | ICD-10-CM

## 2020-02-27 DIAGNOSIS — G8929 Other chronic pain: Secondary | ICD-10-CM

## 2020-02-27 DIAGNOSIS — M79671 Pain in right foot: Secondary | ICD-10-CM

## 2020-02-27 DIAGNOSIS — N920 Excessive and frequent menstruation with regular cycle: Secondary | ICD-10-CM | POA: Diagnosis not present

## 2020-02-27 DIAGNOSIS — I10 Essential (primary) hypertension: Secondary | ICD-10-CM
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
## 2020-02-27 NOTE — Patient Instructions (Addendum)
Hypocalcemia, Adult Hypocalcemia is when the level of calcium in a person's blood is below normal. Calcium is a mineral that is used by the body in many ways. Not having enough blood calcium can affect the nervous system. This can lead to problems with muscles, the heart, and the brain. What are the causes? This condition may be caused by:  A deficiency of vitamin D or magnesium or both.  Decreased levels of parathyroid hormone (hypoparathyroidism).  Kidney function problems.  Low levels of a body protein called albumin.  Inflammation of the pancreas (pancreatitis).  Not taking in enough vitamins and minerals in the diet or having intestinal problems that interfere with nutrient absorption.  Certain medicines. What are the signs or symptoms? Some people may not have any symptoms, especially if they have long-term (chronic) hypocalcemia. Symptoms of this condition may include:  Numbness and tingling in the fingers, toes, or around the mouth.  Muscle twitching, aches, or cramps, especially in the legs, feet, and back.  Spasm of the voice box (laryngospasm). This may make it difficult to breath or speak.  Fast heartbeats (palpitations) and abnormal heart rhythms (arrhythmias).  Shaking uncontrollably (seizures).  Memory problems, confusion, or difficulty thinking.  Depression, anxiety, irritability, or changes in personality. Long-term symptoms of this condition may include:  Coarse, brittle hair and nails.  Dry skin or lasting skin diseases (psoriasis, eczema, or dermatitis).  Dental cavities.  Clouding of the eye lens (cataracts). How is this diagnosed?  This condition is usually diagnosed with a blood test. You may also have other tests to help determine the underlying cause of the condition. This may include more blood tests and imaging tests. How is this treated? This condition may be treated with:  Calcium given by mouth (orally) or given through an IV. The method  used for giving calcium will depend on the severity of the condition. If your condition is severe, you may need to be closely monitored in the hospital.  Giving other minerals (electrolytes), such as magnesium. Other treatment will depend on the cause of the condition. Follow these instructions at home:  Follow diet instructions from your health care provider or dietitian.  Take supplements only as told by your health care provider.  Keep all follow-up visits as told by your health care provider. This is important. Contact a health care provider if you:  Have increased muscle twitching or cramps.  Have new swelling in the feet, ankles, or legs.  Develop changes in mood, memory, or personality. Get help right away if you:  Have chest pain.  Have persistent rapid or irregular heartbeats.  Have difficulty breathing.  Faint.  Start to have seizures.  Have confusion. Summary  Hypocalcemia is when the level of calcium in a person's blood is below normal. Not having enough blood calcium can affect the nervous system. This can lead to problems with muscles, the heart, and the brain.  This condition may be treated with calcium given by mouth or through an IV, taking other minerals, and treating the underlying cause of hypocalcemia.  Take supplements only as told by your health care provider.  Contact a health care provider if you have new or worsening symptoms.  Keep all follow-up visits as told by your health care provider. This is important. This information is not intended to replace advice given to you by your health care provider. Make sure you discuss any questions you have with your health care provider. Document Revised: 07/14/2018 Document Reviewed: 07/14/2018 Elsevier Patient  Education  [employer].  Hypokalemia Hypokalemia means that the amount of potassium in the blood is lower than normal. Potassium is a chemical (electrolyte) that helps regulate the amount  of fluid in the body. It also stimulates muscle tightening (contraction) and helps nerves work properly. Normally, most of the body's potassium is inside cells, and only a very small amount is in the blood. Because the amount in the blood is so small, minor changes to potassium levels in the blood can be life-threatening. What are the causes? This condition may be caused by:  Antibiotic medicine.  Diarrhea or vomiting. Taking too much of a medicine that helps you have a bowel movement (laxative) can cause diarrhea and lead to hypokalemia.  Chronic kidney disease (CKD).  Medicines that help the body get rid of excess fluid (diuretics).  Eating disorders, such as bulimia.  Low magnesium levels in the body.  Sweating a lot. What are the signs or symptoms? Symptoms of this condition include:  Weakness.  Constipation.  Fatigue.  Muscle cramps.  Mental confusion.  Skipped heartbeats or irregular heartbeat (palpitations).  Tingling or numbness. How is this diagnosed? This condition is diagnosed with a blood test. How is this treated? This condition may be treated by:  Taking potassium supplements by mouth.  Adjusting the medicines that you take.  Eating more foods that contain a lot of potassium. If your potassium level is very low, you may need to get potassium through an IV and be monitored in the hospital. Follow these instructions at home:   Take over-the-counter and prescription medicines only as told by your health care provider. This includes vitamins and supplements.  Eat a healthy diet. A healthy diet includes fresh fruits and vegetables, whole grains, healthy fats, and lean proteins.  If instructed, eat more foods that contain a lot of potassium. This includes: ? Nuts, such as peanuts and pistachios. ? Seeds, such as sunflower seeds and pumpkin seeds. ? Peas, lentils, and lima beans. ? Whole grain and bran cereals and breads. ? Fresh fruits and vegetables,  such as apricots, avocado, bananas, cantaloupe, kiwi, oranges, tomatoes, asparagus, and potatoes. ? Orange juice. ? Tomato juice. ? Red meats. ? Yogurt.  Keep all follow-up visits as told by your health care provider. This is important. Contact a health care provider if you:  Have weakness that gets worse.  Feel your heart pounding or racing.  Vomit.  Have diarrhea.  Have diabetes (diabetes mellitus) and you have trouble keeping your blood sugar (glucose) in your target range. Get help right away if you:  Have chest pain.  Have shortness of breath.  Have vomiting or diarrhea that lasts for more than 2 days.  Faint. Summary  Hypokalemia means that the amount of potassium in the blood is lower than normal.  This condition is diagnosed with a blood test.  Hypokalemia may be treated by taking potassium supplements, adjusting the medicines that you take, or eating more foods that are high in potassium.  If your potassium level is very low, you may need to get potassium through an IV and be monitored in the hospital. This information is not intended to replace advice given to you by your health care provider. Make sure you discuss any questions you have with your health care provider. Document Revised: 02/15/2018 Document Reviewed: 02/15/2018 Elsevier Patient Education  [place].  Iron Deficiency Anemia, Adult Iron-deficiency anemia is when you have a low amount of red blood cells or hemoglobin. This  happens because you have too little iron in your body. Hemoglobin carries oxygen to parts of the body. Anemia can cause your body to not get enough oxygen. It may or may not cause symptoms. Follow these instructions at home: Medicines  Take over-the-counter and prescription medicines only as told by your doctor. This includes iron pills (supplements) and vitamins.  If you cannot handle taking iron pills by mouth, ask your doctor about getting iron through: ? A vein  (intravenously). ? A shot (injection) into a muscle.  Take iron pills when your stomach is empty. If you cannot handle this, take them with food.  Do not drink milk or take antacids at the same time as your iron pills.  To prevent trouble pooping (constipation), eat fiber or take medicine (stool softener) as told by your doctor. Eating and drinking   Talk with your doctor before changing the foods you eat. He or she may tell you to eat foods that have a lot of iron, such as: ? Liver. ? Lowfat (lean) beef. ? Breads and cereals that have iron added to them (fortified breads and cereals). ? Eggs. ? Dried fruit. ? Dark green, leafy vegetables.  Drink enough fluid to keep your pee (urine) clear or pale yellow.  Eat fresh fruits and vegetables that are high in vitamin C. They help your body to use iron. Foods with a lot of vitamin C include: ? Oranges. ? Peppers. ? Tomatoes. ? Mangoes. General instructions  Return to your normal activities as told by your doctor. Ask your doctor what activities are safe for you.  Keep yourself clean, and keep things clean around you (your surroundings). Anemia can make you get sick more easily.  Keep all follow-up visits as told by your doctor. This is important. Contact a doctor if:  You feel sick to your stomach (nauseous).  You throw up (vomit).  You feel weak.  You are sweating for no clear reason.  You have trouble pooping, such as: ? Pooping (having a bowel movement) less than 3 times a week. ? Straining to poop. ? Having poop that is hard, dry, or larger than normal. ? Feeling full or bloated. ? Pain in the lower belly. ? Not feeling better after pooping. Get help right away if:  You pass out (faint). If this happens, do not drive yourself to the hospital. Call your local emergency services (911 in the [place]  You have chest pain.  You have shortness of breath that: ? Is very bad. ? Gets worse with physical activity.  You  have a fast heartbeat.  You get light-headed when getting up from sitting or lying down. This information is not intended to replace advice given to you by your health care provider. Make sure you discuss any questions you have with your health care provider. Document Revised: 06/17/2017 Document Reviewed: 03/24/2016 Elsevier Patient Education  [place].

## 2020-02-28 ENCOUNTER — Other Ambulatory Visit: Payer: Self-pay | Admitting: Family Medicine

## 2020-02-28 DIAGNOSIS — E559 Vitamin D deficiency, unspecified: Secondary | ICD-10-CM

## 2020-02-28 LAB — CBC WITH DIFFERENTIAL/PLATELET
Absolute Monocytes: 518 cells/uL (ref 200–950)
Basophils Absolute: 58 cells/uL (ref 0–200)
Basophils Relative: 0.6 %
Eosinophils Absolute: 250 cells/uL (ref 15–500)
Eosinophils Relative: 2.6 %
HCT: 29.8 % — ABNORMAL LOW (ref 35.0–45.0)
Hemoglobin: 8.4 g/dL — ABNORMAL LOW (ref 11.7–15.5)
Lymphs Abs: 2582 cells/uL (ref 850–3900)
MCH: 19.4 pg — ABNORMAL LOW (ref 27.0–33.0)
MCHC: 28.2 g/dL — ABNORMAL LOW (ref 32.0–36.0)
MCV: 69 fL — ABNORMAL LOW (ref 80.0–100.0)
MPV: 9.7 fL (ref 7.5–12.5)
Monocytes Relative: 5.4 %
Neutro Abs: 6192 cells/uL (ref 1500–7800)
Neutrophils Relative %: 64.5 %
Platelets: 485 10*3/uL — ABNORMAL HIGH (ref 140–400)
RBC: 4.32 10*6/uL (ref 3.80–5.10)
RDW: 18.5 % — ABNORMAL HIGH (ref 11.0–15.0)
Total Lymphocyte: 26.9 %
WBC: 9.6 10*3/uL (ref 3.8–10.8)

## 2020-02-28 LAB — BASIC METABOLIC PANEL WITH GFR
BUN: 13 mg/dL (ref 7–25)
CO2: 26 mmol/L (ref 20–32)
Calcium: 9.1 mg/dL (ref 8.6–10.2)
Chloride: 102 mmol/L (ref 98–110)
Creat: 0.93 mg/dL (ref 0.50–1.10)
GFR, Est African American: 84 mL/min/{1.73_m2} (ref >=60)
GFR, Est Non African American: 72 mL/min/{1.73_m2} (ref >=60)
Glucose, Bld: 96 mg/dL (ref 65–99)
Potassium: 3.9 mmol/L (ref 3.5–5.3)
Sodium: 136 mmol/L (ref 135–146)

## 2020-02-28 LAB — CBC MORPHOLOGY

## 2020-02-28 LAB — TSH: TSH: 0.65 mIU/L

## 2020-02-28 LAB — IRON, TOTAL/TOTAL IRON BINDING CAP
%SAT: 1 % (calc) — ABNORMAL LOW (ref 16–45)
Iron: <10 ug/dL — ABNORMAL LOW (ref 40–190)
TIBC: 452 mcg/dL (calc) — ABNORMAL HIGH (ref 250–450)

## 2020-02-28 LAB — FERRITIN: Ferritin: 5 ng/mL — ABNORMAL LOW (ref 16–232)

## 2020-02-28 LAB — VITAMIN D 25 HYDROXY (VIT D DEFICIENCY, FRACTURES): Vit D, 25-Hydroxy: 21 ng/mL — ABNORMAL LOW (ref 30–100)

## 2020-02-28 LAB — T4, FREE: Free T4: 1.1 ng/dL (ref 0.8–1.8)

## 2020-02-28 MED ORDER — VITAMIN D (ERGOCALCIFEROL) 1.25 MG (50000 UNIT) PO CAPS: 50000.0000 [IU] | ORAL_CAPSULE | ORAL | 0 refills | Status: DC

## 2020-02-28 MED FILL — VIT D2 1.25 MG (50,000 UNIT: 1.25 MG | 84 days supply | Qty: 12 | Fill #0

## 2020-03-03 ENCOUNTER — Ambulatory Visit (INDEPENDENT_AMBULATORY_CARE_PROVIDER_SITE_OTHER): Payer: 59

## 2020-03-03 ENCOUNTER — Other Ambulatory Visit: Payer: Self-pay

## 2020-03-03 DIAGNOSIS — N920 Excessive and frequent menstruation with regular cycle: Secondary | ICD-10-CM | POA: Diagnosis not present

## 2020-03-03 NOTE — Progress Notes (Signed)
PELVIC US TA/TV: enlarged heterogeneous anteverted uterus with mult.fibroids, largest fibroids: (#1) fundal subserosal fibroid 8.5 x 7.1 x 6.9 cm,(#2) calcified anterior fibroid 8.3 x 6.1 cm,(#3) submucosal fibroid 4.9 x 2.2 x 4.6 cm,EEC 12.6 mm,normal ovaries,ovaries only visualized on T/A images,no free fluid,no pain during ultrasound

## 2020-03-05 ENCOUNTER — Inpatient Hospital Stay: Payer: 59

## 2020-03-05 ENCOUNTER — Encounter: Payer: Self-pay | Admitting: Internal Medicine

## 2020-03-05 ENCOUNTER — Inpatient Hospital Stay: Payer: 59 | Attending: Internal Medicine | Admitting: Internal Medicine

## 2020-03-05 ENCOUNTER — Other Ambulatory Visit: Payer: Self-pay

## 2020-03-05 DIAGNOSIS — D509 Iron deficiency anemia, unspecified: Secondary | ICD-10-CM | POA: Diagnosis not present

## 2020-03-05 DIAGNOSIS — E611 Iron deficiency: Secondary | ICD-10-CM | POA: Insufficient documentation

## 2020-03-05 DIAGNOSIS — N920 Excessive and frequent menstruation with regular cycle: Secondary | ICD-10-CM | POA: Insufficient documentation

## 2020-03-05 NOTE — Progress Notes (Signed)
[facility] NOTE  Patient Care Team: Billie Ruddy, MD as PCP - General (Family Medicine)  CHIEF COMPLAINTS/PURPOSE OF CONSULTATION:    HEMATOLOGY HISTORY  #Severe iron deficient anemia -hemoglobin 8.3 [iron saturation 1% ferritin 5; August 2021 PCP]  #Menorrhagia [GN work-up]  HISTORY OF PRESENTING ILLNESS:  Mia Carlson 49 y.o.  female has been referred to [place] for further evaluation/work-up for anemia.  Patient admits to heavy menstrual cycles.  Currently undergoing work-up with gynecology in [place].  Short of breath on exertion.  Extreme fatigue.  Blood in stools: None Change in bowel habits- None Blood in urine: None Difficulty swallowing: None Abnormal weight loss: None Iron supplementation: on PO x 2 weeks Prior Blood transfusions: 1989- during 1st child birth Vaginal bleeding: very heavy menstrual cycles PICA-ice   Review of Systems  Constitutional: Positive for malaise/fatigue. Negative for chills, diaphoresis, fever and weight loss.  HENT: Negative for nosebleeds and sore throat.   Eyes: Negative for double vision.  Respiratory: Positive for shortness of breath. Negative for cough, hemoptysis, sputum production and wheezing.   Cardiovascular: Negative for chest pain, palpitations, orthopnea and leg swelling.  Gastrointestinal: Negative for abdominal pain, blood in stool, constipation, diarrhea, heartburn, melena, nausea and vomiting.  Genitourinary: Negative for dysuria, frequency and urgency.  Musculoskeletal: Positive for joint pain. Negative for back pain.  Skin: Negative.  Negative for itching and rash.  Neurological: Negative for dizziness, tingling, focal weakness, weakness and headaches.  Endo/Heme/Allergies: Does not bruise/bleed easily.  Psychiatric/Behavioral: Negative for depression. The patient is not nervous/anxious and does not have insomnia.     MEDICAL HISTORY:  Past Medical History:  Diagnosis Date  .  Anemia   . History of low potassium   . Hypertension   . Low calcium levels     SURGICAL HISTORY: Past Surgical History:  Procedure Laterality Date  . FOOT SURGERY Right   . tubal      SOCIAL HISTORY: Social History   Socioeconomic History  . Marital status: Divorced    Spouse name: Not on file  . Number of children: Not on file  . Years of education: Not on file  . Highest education level: Not on file  Occupational History  . Not on file  Tobacco Use  . Smoking status: Never Smoker  . Smokeless tobacco: Never Used  Vaping Use  . Vaping Use: Never used  Substance and Sexual Activity  . Alcohol use: Yes    Comment: glass of wine at night  . Drug use: No  . Sexual activity: Not Currently    Birth control/protection: Surgical    Comment: tubal  Other Topics Concern  . Not on file  Social History Narrative   Lives in [place] [used in live in [place]]; WL/sanitation-OR; no smoking; wine.    Social Determinants of Health   Financial Resource Strain:   . Difficulty of Paying Living Expenses:   Food Insecurity:   . Worried About Charity fundraiser in the Last Year:   . Arboriculturist in the Last Year:   Transportation Needs:   . Film/video editor (Medical):   Marland Kitchen Lack of Transportation (Non-Medical):   Physical Activity:   . Days of Exercise per Week:   . Minutes of Exercise per Session:   Stress:   . Feeling of Stress :   Social Connections:   . Frequency of Communication with Friends and Family:   . Frequency of Social Gatherings with Friends and  Family:   . Attends Religious Services:   . Active Member of Clubs or Organizations:   . Attends Archivist Meetings:   Marland Kitchen Marital Status:   Intimate Partner Violence:   . Fear of Current or Ex-Partner:   . Emotionally Abused:   Marland Kitchen Physically Abused:   . Sexually Abused:     FAMILY HISTORY: Family History  Problem Relation Age of Onset  . Hypertension Paternal Grandfather   . Heart attack  Father     ALLERGIES:  is allergic to eggs or egg-derived products.  MEDICATIONS:  Current Outpatient Medications  Medication Sig Dispense Refill  . Albuterol Sulfate (PROAIR RESPICLICK) 885 (90 Base) MCG/ACT AEPB Inhale 2 puffs into the lungs every 6 (six) hours as needed. 1 each 1  . amLODipine (NORVASC) 10 MG tablet TAKE 1 TABLET BY MOUTH ONCE A DAY 90 tablet 2  . ferrous sulfate 325 (65 FE) MG tablet Take 1 tablet (325 mg total) by mouth daily. 30 tablet 0  . triamterene-hydrochlorothiazide (MAXZIDE-25) 37.5-25 MG tablet TAKE 1 TABLET BY MOUTH ONCE A DAY 90 tablet 1  . Vitamin D, Ergocalciferol, (DRISDOL) 1.25 MG (50000 UNIT) CAPS capsule Take 1 capsule (50,000 Units total) by mouth every 7 (seven) days. 12 capsule 0  . gabapentin (NEURONTIN) 100 MG capsule Take 2 capsules (200 mg total) by mouth 3 (three) times daily. 180 capsule 1   No current facility-administered medications for this visit.      PHYSICAL EXAMINATION:   Vitals:   03/05/20 1119  BP: (!) 150/88  Pulse: 85  Temp: 98.9 F (37.2 C)  SpO2: 100%   Filed Weights   03/05/20 1119  Weight: 234 lb 12.8 oz (106.5 kg)    Physical Exam Constitutional:      Comments: Accompanied by her aunt.  HENT:     Head: Normocephalic and atraumatic.     Mouth/Throat:     Pharynx: No oropharyngeal exudate.  Eyes:     Pupils: Pupils are equal, round, and reactive to light.  Cardiovascular:     Rate and Rhythm: Normal rate and regular rhythm.  Pulmonary:     Effort: Pulmonary effort is normal. No respiratory distress.     Breath sounds: Normal breath sounds. No wheezing.  Abdominal:     General: Bowel sounds are normal. There is no distension.     Palpations: Abdomen is soft. There is no mass.     Tenderness: There is no abdominal tenderness. There is no guarding or rebound.  Musculoskeletal:        General: No tenderness. Normal range of motion.     Cervical back: Normal range of motion and neck supple.  Skin:     General: Skin is warm.  Neurological:     Mental Status: She is alert and oriented to person, place, and time.  Psychiatric:        Mood and Affect: Affect normal.     LABORATORY DATA:  I have reviewed the data as listed Lab Results  Component Value Date   WBC 9.6 02/27/2020   HGB 8.4 (L) 02/27/2020   HCT 29.8 (L) 02/27/2020   MCV 69.0 (L) 02/27/2020   PLT 485 (H) 02/27/2020   Recent Labs    02/16/20 2244 02/27/20 1034  NA 139 136  K 3.3* 3.9  CL 101 102  CO2 26 26  GLUCOSE 99 96  BUN 10 13  CREATININE 0.83 0.93  CALCIUM 8.8* 9.1  GFRNONAA >60 72  GFRAA >60  84     No results found.  Iron deficiency #Severe anemia hemoglobin 8.3 [ferritin 5 saturation 1%]-likely secondary to menorrhagia [see below].  Continue p.o. iron.  However add IV iron infusions. Discussed the potential acute infusion reactions with IV iron; which are quite rare.  Patient understands the risk; will proceed with infusions.  # Etiology- likely menorrhagia; awaiting further work with Gynecology [[place]].   Thank you Dr.Bank for allowing me to participate in the care of your pleasant patient. Please do not hesitate to contact me with questions or concerns in the interim.  # DISPOSITION: # schedule IV venofer weekly x 3; first ASAP  # follow up in 6 weeks; MD ;labs- cbc/LDH; possible venofer Dr.B     All questions were answered. The patient knows to call the clinic with any problems, questions or concerns.      Cammie Sickle, MD 03/05/2020 12:55 PM

## 2020-03-05 NOTE — Assessment & Plan Note (Addendum)
#  Severe anemia hemoglobin 8.3 [ferritin 5 saturation 1%]-likely secondary to menorrhagia [see below].  Continue p.o. iron.  However add IV iron infusions. Discussed the potential acute infusion reactions with IV iron; which are quite rare.  Patient understands the risk; will proceed with infusions.  # Etiology- likely menorrhagia; awaiting further work with Gynecology [[place]].   Thank you Dr.Bank for allowing me to participate in the care of your pleasant patient. Please do not hesitate to contact me with questions or concerns in the interim.  # DISPOSITION: # schedule IV venofer weekly x 3; first ASAP  # follow up in 6 weeks; MD ;labs- cbc/LDH; possible venofer Dr.B

## 2020-03-06 ENCOUNTER — Encounter: Payer: Self-pay | Admitting: Adult Health

## 2020-03-06 ENCOUNTER — Telehealth: Payer: Self-pay | Admitting: Adult Health

## 2020-03-06 DIAGNOSIS — D219 Benign neoplasm of connective and other soft tissue, unspecified: Secondary | ICD-10-CM

## 2020-03-06 HISTORY — DX: Benign neoplasm of connective and other soft tissue, unspecified: D21.9

## 2020-03-06 NOTE — Telephone Encounter (Signed)
Pt aware that US showed enlarged uterus with multiple fibroids, will make appt with Dr Elonda Husky to talk about  Hysterectomy, continue taking iron bid

## 2020-03-12 ENCOUNTER — Other Ambulatory Visit: Payer: Self-pay

## 2020-03-12 ENCOUNTER — Inpatient Hospital Stay: Payer: 59

## 2020-03-12 VITALS — BP 117/78 | HR 71 | Temp 97.0°F

## 2020-03-12 DIAGNOSIS — E611 Iron deficiency: Secondary | ICD-10-CM

## 2020-03-12 DIAGNOSIS — D500 Iron deficiency anemia secondary to blood loss (chronic): Secondary | ICD-10-CM

## 2020-03-12 DIAGNOSIS — N920 Excessive and frequent menstruation with regular cycle: Secondary | ICD-10-CM | POA: Diagnosis not present

## 2020-03-12 DIAGNOSIS — D509 Iron deficiency anemia, unspecified: Secondary | ICD-10-CM | POA: Diagnosis not present

## 2020-03-12 MED ORDER — SODIUM CHLORIDE 0.9 % IV SOLN: 200.0000 mg | Freq: Once | INTRAVENOUS | Status: DC

## 2020-03-12 MED ORDER — SODIUM CHLORIDE 0.9 % IV SOLN
Freq: Once | INTRAVENOUS | Status: AC
  Filled 2020-03-12: qty 250

## 2020-03-12 MED ORDER — IRON SUCROSE 20 MG/ML IV SOLN
200.0000 mg | Freq: Once | INTRAVENOUS | Status: AC
  Administered 2020-03-12: 200 mg via INTRAVENOUS
  Filled 2020-03-12: qty 10

## 2020-03-12 NOTE — Progress Notes (Signed)
Pt tolerated venofer infusion well with no signs of complications. VSS. RN educated pt on the importance of notifying the clinic if any complications occur at home. Pt verbalized understanding and all questions answered at this time. Pt stable for discharge.   Idalie Canto [employer]

## 2020-03-19 ENCOUNTER — Other Ambulatory Visit: Payer: Self-pay

## 2020-03-19 ENCOUNTER — Inpatient Hospital Stay: Payer: 59 | Attending: Internal Medicine

## 2020-03-19 VITALS — BP 125/82 | HR 64 | Temp 96.9°F | Resp 18

## 2020-03-19 DIAGNOSIS — D500 Iron deficiency anemia secondary to blood loss (chronic): Secondary | ICD-10-CM

## 2020-03-19 DIAGNOSIS — N920 Excessive and frequent menstruation with regular cycle: Secondary | ICD-10-CM | POA: Insufficient documentation

## 2020-03-19 DIAGNOSIS — D509 Iron deficiency anemia, unspecified: Secondary | ICD-10-CM | POA: Diagnosis not present

## 2020-03-19 DIAGNOSIS — E611 Iron deficiency: Secondary | ICD-10-CM

## 2020-03-19 MED ORDER — IRON SUCROSE 20 MG/ML IV SOLN
200.0000 mg | Freq: Once | INTRAVENOUS | Status: AC
  Administered 2020-03-19: 200 mg via INTRAVENOUS
  Filled 2020-03-19: qty 10

## 2020-03-19 MED ORDER — SODIUM CHLORIDE 0.9 % IV SOLN: 200.0000 mg | Freq: Once | INTRAVENOUS | Status: DC

## 2020-03-19 MED ORDER — SODIUM CHLORIDE 0.9 % IV SOLN
Freq: Once | INTRAVENOUS | Status: AC
  Filled 2020-03-19: qty 250

## 2020-03-19 NOTE — Progress Notes (Signed)
Pt tolerated infusion well. No s/s of distress or reaction noted. Pt and VS stable at discharge.  

## 2020-03-20 ENCOUNTER — Other Ambulatory Visit: Payer: Self-pay | Admitting: Family Medicine

## 2020-03-20 ENCOUNTER — Telehealth: Payer: Self-pay | Admitting: Family Medicine

## 2020-03-20 DIAGNOSIS — I10 Essential (primary) hypertension: Secondary | ICD-10-CM

## 2020-03-20 MED FILL — TRIAMTERENE-HCTZ 37.5-25 MG: 37.5-25 | 90 days supply | Qty: 90 | Fill #0

## 2020-03-20 MED FILL — AMLODIPINE BESYLATE 10 MG T: 10 | 90 days supply | Qty: 90 | Fill #1

## 2020-03-20 NOTE — Telephone Encounter (Signed)
Pt call and stated she need to be out of work when she take the Iron infusion because she is not able to go to work until the next day . She also stated 03/12/20 was the first day,her FLMA papers will be coming to dr.Banks.

## 2020-03-21 ENCOUNTER — Ambulatory Visit (INDEPENDENT_AMBULATORY_CARE_PROVIDER_SITE_OTHER): Payer: 59 | Admitting: Obstetrics & Gynecology

## 2020-03-21 ENCOUNTER — Encounter: Payer: Self-pay | Admitting: Obstetrics & Gynecology

## 2020-03-21 ENCOUNTER — Other Ambulatory Visit: Payer: Self-pay | Admitting: Obstetrics & Gynecology

## 2020-03-21 VITALS — BP 153/100 | HR 83 | Ht 63.0 in | Wt 239.0 lb

## 2020-03-21 DIAGNOSIS — D509 Iron deficiency anemia, unspecified: Secondary | ICD-10-CM

## 2020-03-21 DIAGNOSIS — D219 Benign neoplasm of connective and other soft tissue, unspecified: Secondary | ICD-10-CM | POA: Diagnosis not present

## 2020-03-21 DIAGNOSIS — N946 Dysmenorrhea, unspecified: Secondary | ICD-10-CM

## 2020-03-21 DIAGNOSIS — N920 Excessive and frequent menstruation with regular cycle: Secondary | ICD-10-CM

## 2020-03-21 MED ORDER — MEGESTROL ACETATE 40 MG PO TABS: ORAL_TABLET | ORAL | 3 refills | Status: DC

## 2020-03-21 MED FILL — MEGESTROL 40 MG TABLET: 40 | 30 days supply | Qty: 45 | Fill #0

## 2020-03-21 NOTE — Progress Notes (Signed)
Follow up appointment for results  Chief Complaint  Patient presents with  . Discuss surgery    Menorrhagia    Blood pressure (!) 153/100, pulse 83, height 5\' 3"  (1.6 m), weight 239 lb (108.4 kg).    GYNECOLOGIC SONOGRAM   Mia Carlson is a 49 y.o. [id number] Patient's last menstrual period was 02/11/2020. She is here for a pelvic sonogram for menorrhagia.  Uterus                      15 x 9.7 x 13.3 cm, Total uterine volume 1005.8 cc, enlarged heterogeneous anteverted uterus with mult.fibroids  Endometrium          12.6 mm, symmetrical, submucosal fibroid 4.9 x 2.2 x 4.6 cm  Right ovary             3.8 x 2.1 x 3.9 cm, wnl  Left ovary                3.2 x 1.9 x 2.9 cm, wnl  No free fluid   Fibroids: largest fibroids: (#1) fundal subserosal fibroid 8.5 x 7.1 x 6.9 cm,(#2) calcified anterior fibroid 8.3 x 6.1 cm,(#3) submucosal fibroid 4.9 x 2.2 x 4.6 cm   Technician Comments:  PELVIC US TA/TV: enlarged heterogeneous anteverted uterus with mult.fibroids, largest fibroids: (#1) fundal subserosal fibroid 8.5 x 7.1 x 6.9 cm,(#2) calcified anterior fibroid 8.3 x 6.1 cm,(#3) submucosal fibroid 4.9 x 2.2 x 4.6 cm,EEC 12.6 mm, normalovaries,ovaries only visualized on T/A images,no free fluid,no pain during ultrasound     [employer] 03/03/2020 5:17 PM Clinical Impression and recommendations:  I have reviewed the sonogram results above.  Gynecologic ultrasound for suspected fibroid uterus, heavy periods and dysmenorrhea  Combined with the patient's current clinical course, below are my impressions and any appropriate recommendations for management based on the sonographic findings:   FINDINGS: Diffusely enlarged uterus with several fibroids intramural and subserosal up to 8 cm diameter     IMPRESSION: Fibroid uterus enlarged to greater than 1000 g estimate size.                                      Normal ovaries  Jonnie Kind, MD            MEDS ordered this encounter: Meds ordered this encounter  Medications  . DISCONTD: megestrol (MEGACE) 40 MG tablet    Sig: 3 tablets a day for 5 days, 2 tablets a day for 5 days then 1 tablet daily    Dispense:  45 tablet    Refill:  3    Orders for this encounter: No orders of the defined types were placed in this encounter.   Impression:   ICD-10-CM   1. Fibroids  D21.9   2. Menorrhagia with regular cycle  N92.0   3. Dysmenorrhea  N94.6   4. 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       Face to face time:  10 minutes  Greater than 50% of the visit time was spent in counseling and coordination of care with  the patient.  The summary and outline of the counseling and care coordination is summarized in the note above.   All questions were answered.  Past Medical History:  Diagnosis Date  . Anemia   . Fibroids 03/06/2020  . History of low potassium   . Hypertension   . Low calcium levels     Past Surgical History:  Procedure Laterality Date  . FOOT SURGERY Right   . tubal      OB History    Gravida  5   Para  4   Term  4   Preterm      AB  1   Living  4     SAB      IAB      Ectopic      Multiple      Live Births  4           Allergies  Allergen Reactions  . Eggs Or Egg-Derived Products Nausea And Vomiting    Social History   Socioeconomic History  . Marital status: Divorced    Spouse name: Not on file  . Number of children: Not on file  . Years of education: Not on file  . Highest education level: Not on file  Occupational History  . Not on file  Tobacco Use  . Smoking status: Never Smoker  . Smokeless tobacco: Never  Used  Vaping Use  . Vaping Use: Never used  Substance and Sexual Activity  . Alcohol use: Yes    Comment: glass of wine at night  . Drug use: No  . Sexual activity: Not Currently    Birth control/protection: Surgical    Comment: tubal  Other Topics Concern  . Not on file  Social History Narrative   Lives in [place] [used in live in [place]]; WL/sanitation-OR; no smoking; wine.    Social Determinants of Health   Financial Resource Strain: Not on file  Food Insecurity: Not on file  Transportation Needs: Not on file  Physical Activity: Not on file  Stress: Not on file  Social Connections: Not on file    Family History  Problem Relation Age of Onset  . Hypertension Paternal Grandfather   . Heart attack Father

## 2020-03-26 ENCOUNTER — Other Ambulatory Visit: Payer: Self-pay

## 2020-03-26 ENCOUNTER — Inpatient Hospital Stay: Payer: 59

## 2020-03-26 VITALS — BP 123/71 | HR 68 | Temp 96.9°F | Resp 18

## 2020-03-26 DIAGNOSIS — E611 Iron deficiency: Secondary | ICD-10-CM

## 2020-03-26 DIAGNOSIS — D509 Iron deficiency anemia, unspecified: Secondary | ICD-10-CM | POA: Diagnosis not present

## 2020-03-26 DIAGNOSIS — N920 Excessive and frequent menstruation with regular cycle: Secondary | ICD-10-CM | POA: Diagnosis not present

## 2020-03-26 DIAGNOSIS — D500 Iron deficiency anemia secondary to blood loss (chronic): Secondary | ICD-10-CM

## 2020-03-26 MED ORDER — SODIUM CHLORIDE 0.9 % IV SOLN
Freq: Once | INTRAVENOUS | Status: AC
  Filled 2020-03-26: qty 250

## 2020-03-26 MED ORDER — SODIUM CHLORIDE 0.9 % IV SOLN: 200.0000 mg | Freq: Once | INTRAVENOUS | Status: DC

## 2020-03-26 MED ORDER — IRON SUCROSE 20 MG/ML IV SOLN
200.0000 mg | Freq: Once | INTRAVENOUS | Status: AC
  Administered 2020-03-26: 200 mg via INTRAVENOUS
  Filled 2020-03-26: qty 10

## 2020-03-27 ENCOUNTER — Telehealth: Payer: Self-pay | Admitting: Family Medicine

## 2020-03-27 NOTE — Telephone Encounter (Signed)
Pt has an appt with PCP on Monday 9/13 regarding her L knee pain. She is wondering since she works at [employer] if her PCP can go ahead and send an order for an xray so she can have it done prior to her appt on Monday and be discussed during her appt.  Pt can be reached at [phone]

## 2020-03-28 ENCOUNTER — Telehealth: Payer: Self-pay | Admitting: Family Medicine

## 2020-03-28 NOTE — Telephone Encounter (Signed)
Patient had FMLA forms faxed   When completed fax to: [phone]  Disposition: Dr's Folder

## 2020-03-28 NOTE — Telephone Encounter (Signed)
Patient would like an order for X-Ray before her appointment since she work at [employer]

## 2020-03-28 NOTE — Telephone Encounter (Signed)
Spoke with pt advised that Dr Volanda Napoleon will need to see on Monday for the knee problem then send her for imaging if any need for one, pt verbalized understanding

## 2020-03-28 NOTE — Telephone Encounter (Signed)
FYI

## 2020-03-31 ENCOUNTER — Other Ambulatory Visit: Payer: Self-pay

## 2020-03-31 ENCOUNTER — Ambulatory Visit: Payer: 59 | Admitting: Family Medicine

## 2020-03-31 ENCOUNTER — Ambulatory Visit (INDEPENDENT_AMBULATORY_CARE_PROVIDER_SITE_OTHER)
Admission: RE | Admit: 2020-03-31 | Discharge: 2020-03-31 | Disposition: A | Discharge status: Home / Self Care | Payer: 59 | Source: Ambulatory Visit | Attending: Family Medicine | Admitting: Family Medicine

## 2020-03-31 ENCOUNTER — Encounter: Payer: Self-pay | Admitting: Family Medicine

## 2020-03-31 VITALS — BP 146/98 | HR 100 | Temp 98.3°F | Wt 240.0 lb

## 2020-03-31 DIAGNOSIS — Z23 Encounter for immunization: Secondary | ICD-10-CM

## 2020-03-31 DIAGNOSIS — M25562 Pain in left knee: Secondary | ICD-10-CM | POA: Diagnosis not present

## 2020-03-31 DIAGNOSIS — D509 Iron deficiency anemia, unspecified: Secondary | ICD-10-CM | POA: Diagnosis not present

## 2020-03-31 DIAGNOSIS — M25462 Effusion, left knee: Secondary | ICD-10-CM | POA: Diagnosis not present

## 2020-03-31 DIAGNOSIS — M1712 Unilateral primary osteoarthritis, left knee: Secondary | ICD-10-CM | POA: Diagnosis not present

## 2020-03-31 NOTE — Telephone Encounter (Signed)
Pt form received and placed on Dr Volanda Napoleon folder for completing

## 2020-03-31 NOTE — Progress Notes (Signed)
Subjective:    Patient ID: Mia Carlson, female    DOB: 1970/12/26, 49 y.o.   MRN: [id number]  No chief complaint on file.   HPI Patient was seen today for acute concern of L knee pain and intermittent instability x several wks.  Pt does not recall injury to the knee, hearing any pops, clicks, or feeling any tears, and has not fallen.  At times knee is swollen.  Tried over-the-counter meds and compression sleeve.  Pt notes extra duty at work causes increased pain/discomfort.  Pt receiving iron infusions with oncology for h/o iron deficiency anemia.  Pt notes HA after iron infusion.  Pt states that HA has been so bad she has had to call out from work.  Pt inquires if office has received FMLA papers regarding HAs.  Patient interested in flu vaccine.  Past Medical History:  Diagnosis Date  . Anemia   . Fibroids 03/06/2020  . History of low potassium   . Hypertension   . Low calcium levels     Allergies  Allergen Reactions  . Eggs Or Egg-Derived Products Nausea And Vomiting    ROS General: Denies fever, chills, night sweats, changes in weight, changes in appetite HEENT: Denies ear pain, changes in vision, rhinorrhea, sore throat  +HAs CV: Denies CP, palpitations, SOB, orthopnea Pulm: Denies SOB, cough, wheezing GI: Denies abdominal pain, nausea, vomiting, diarrhea, constipation GU: Denies dysuria, hematuria, frequency, vaginal discharge Msk: Denies muscle cramps, joint pains  + left knee pain Neuro: Denies weakness, numbness, tingling Skin: Denies rashes, bruising Psych: Denies depression, anxiety, hallucinations     Objective:    Blood pressure (!) 146/98, pulse 100, temperature 98.3 F (36.8 C), temperature source Oral, weight 240 lb (108.9 kg), SpO2 99 %.   Gen. Pleasant, well-nourished, in no distress, normal affect   HEENT: [place]/AT, face symmetric, conjunctiva clear, no scleral icterus, PERRLA, EOMI, nares patent without drainage Lungs: no accessory muscle use,  CTAB, no wheezes or rales Cardiovascular: RRR, no m/r/g, no peripheral edema Musculoskeletal: Wearing a compression sleeve on left knee.  No crepitus of bilateral knees.  Right knee without TTP.  Left knee with TTP of inferior patella, tibial tuberosity, medial joint line and posterior medial joint line.  No effusions noted bilaterally.  No deformities, no cyanosis or clubbing, normal tone Neuro:  A&Ox3, CN II-XII intact, normal gait Skin:  Warm, no lesions/ rash   Wt Readings from Last 3 Encounters:  03/31/20 240 lb (108.9 kg)  03/21/20 239 lb (108.4 kg)  03/05/20 234 lb 12.8 oz (106.5 kg)    Lab Results  Component Value Date   WBC 9.6 02/27/2020   HGB 8.4 (L) 02/27/2020   HCT 29.8 (L) 02/27/2020   PLT 485 (H) 02/27/2020   GLUCOSE 96 02/27/2020   NA 136 02/27/2020   K 3.9 02/27/2020   CL 102 02/27/2020   CREATININE 0.93 02/27/2020   BUN 13 02/27/2020   CO2 26 02/27/2020   TSH 0.65 02/27/2020    Assessment/Plan:  Acute pain of left knee  -Discussed possible causes including arthritis-likely given history of right ankle/foot pain , meniscus derangement, ligament tear less likely as no injury noted -Discussed obtaining imaging -Continue supportive care including NSAIDs, heat, rest, elevation, compression.  Consider Voltaren gel -Further recommendations based on imaging -Given note for work. - Plan: DG Knee Complete 4 Views Left  Chronic iron deficiency anemia -2/2 menorrhagia from fibroids -Continue Feraheme infusions -Continue follow-up with OB/GYN for management of fibroids -FMLA paperwork located  and completed.  Need for influenza vaccination  - Plan: Flu Vaccine QUAD 6+ mos PF IM (Fluarix Quad PF)  F/u as needed  Grier Mitts, MD

## 2020-03-31 NOTE — Patient Instructions (Addendum)
Acute Knee Pain, Adult Acute knee pain is sudden and may be caused by damage, swelling, or irritation of the muscles and tissues that support your knee. The injury may result from:  A fall.  An injury to your knee from twisting motions.  A hit to the knee.  Infection. Acute knee pain may go away on its own with time and rest. If it does not, your health care provider may order tests to find the cause of the pain. These may include:  Imaging tests, such as an X-ray, MRI, or ultrasound.  Joint aspiration. In this test, fluid is removed from the knee.  Arthroscopy. In this test, a lighted tube is inserted into the knee and an image is projected onto a TV screen.  Biopsy. In this test, a sample of tissue is removed from the body and studied under a microscope. Follow these instructions at home: Pay attention to any changes in your symptoms. Take these actions to relieve your pain. If you have a knee sleeve or brace:   Wear the sleeve or brace as told by your health care provider. Remove it only as told by your health care provider.  Loosen the sleeve or brace if your toes tingle, become numb, or turn cold and blue.  Keep the sleeve or brace clean.  If the sleeve or brace is not waterproof: ? Do not let it get wet. ? Cover it with a watertight covering when you take a bath or shower. Activity  Rest your knee.  Do not do things that cause pain or make pain worse.  Avoid high-impact activities or exercises, such as running, jumping rope, or doing jumping jacks.  Work with a physical therapist to make a safe exercise program, as recommended by your health care provider. Do exercises as told by your physical therapist. Managing pain, stiffness, and swelling   If directed, put ice on the knee: ? Put ice in a plastic bag. ? Place a towel between your skin and the bag. ? Leave the ice on for 20 minutes, 2-3 times a day.  If directed, use an elastic bandage to put pressure  (compression) on your injured knee. This may control swelling, give support, and help with discomfort. General instructions  Take over-the-counter and prescription medicines only as told by your health care provider.  Raise (elevate) your knee above the level of your heart when you are sitting or lying down.  Sleep with a pillow under your knee.  Do not use any products that contain nicotine or tobacco, such as cigarettes, e-cigarettes, and chewing tobacco. These can delay healing. If you need help quitting, ask your health care provider.  If you are overweight, work with your health care provider and a dietitian to set a weight-loss goal that is healthy and reasonable for you. Extra weight can put pressure on your knee.  Keep all follow-up visits as told by your health care provider. This is important. Contact a health care provider if:  Your knee pain continues, changes, or gets worse.  You have a fever along with knee pain.  Your knee feels warm to the touch.  Your knee buckles or locks up. Get help right away if:  Your knee swells, and the swelling becomes worse.  You cannot move your knee.  You have severe pain in your knee. Summary  Acute knee pain can be caused by a fall, an injury, an infection, or damage, swelling, or irritation of the tissues that support your knee.  Your health care provider may perform tests to find out the cause of the pain.  Pay attention to any changes in your symptoms. Relieve your pain with rest, medicines, light activity, and use of ice.  Get help if your pain continues or becomes worse, your knee swells, or you cannot move your knee. This information is not intended to replace advice given to you by your health care provider. Make sure you discuss any questions you have with your health care provider. Document Revised: 12/15/2017 Document Reviewed: 12/15/2017 Elsevier Patient Education  [place].    Iron Deficiency Anemia,  Adult Iron-deficiency anemia is when you have a low amount of red blood cells or hemoglobin. This happens because you have too little iron in your body. Hemoglobin carries oxygen to parts of the body. Anemia can cause your body to not get enough oxygen. It may or may not cause symptoms. Follow these instructions at home: Medicines  Take over-the-counter and prescription medicines only as told by your doctor. This includes iron pills (supplements) and vitamins.  If you cannot handle taking iron pills by mouth, ask your doctor about getting iron through: ? A vein (intravenously). ? A shot (injection) into a muscle.  Take iron pills when your stomach is empty. If you cannot handle this, take them with food.  Do not drink milk or take antacids at the same time as your iron pills.  To prevent trouble pooping (constipation), eat fiber or take medicine (stool softener) as told by your doctor. Eating and drinking   Talk with your doctor before changing the foods you eat. He or she may tell you to eat foods that have a lot of iron, such as: ? Liver. ? Lowfat (lean) beef. ? Breads and cereals that have iron added to them (fortified breads and cereals). ? Eggs. ? Dried fruit. ? Dark green, leafy vegetables.  Drink enough fluid to keep your pee (urine) clear or pale yellow.  Eat fresh fruits and vegetables that are high in vitamin C. They help your body to use iron. Foods with a lot of vitamin C include: ? Oranges. ? Peppers. ? Tomatoes. ? Mangoes. General instructions  Return to your normal activities as told by your doctor. Ask your doctor what activities are safe for you.  Keep yourself clean, and keep things clean around you (your surroundings). Anemia can make you get sick more easily.  Keep all follow-up visits as told by your doctor. This is important. Contact a doctor if:  You feel sick to your stomach (nauseous).  You throw up (vomit).  You feel weak.  You are sweating  for no clear reason.  You have trouble pooping, such as: ? Pooping (having a bowel movement) less than 3 times a week. ? Straining to poop. ? Having poop that is hard, dry, or larger than normal. ? Feeling full or bloated. ? Pain in the lower belly. ? Not feeling better after pooping. Get help right away if:  You pass out (faint). If this happens, do not drive yourself to the hospital. Call your local emergency services (911 in the [place]  You have chest pain.  You have shortness of breath that: ? Is very bad. ? Gets worse with physical activity.  You have a fast heartbeat.  You get light-headed when getting up from sitting or lying down. This information is not intended to replace advice given to you by your health care provider. Make sure you discuss any questions you have  with your health care provider. Document Revised: 06/17/2017 Document Reviewed: 03/24/2016 Elsevier Patient Education  [place].

## 2020-04-02 ENCOUNTER — Other Ambulatory Visit: Payer: Self-pay

## 2020-04-02 NOTE — Telephone Encounter (Signed)
Pt FMLA paperwork was faxed to [employer] and confirmation received

## 2020-04-03 NOTE — Telephone Encounter (Signed)
Pt FMLA form has been completed and faxed per pt request

## 2020-04-03 NOTE — Telephone Encounter (Signed)
Pt notified through MyChart.

## 2020-04-14 MED FILL — MEGESTROL 40 MG TABLET: 40 | 30 days supply | Qty: 45 | Fill #1

## 2020-04-16 ENCOUNTER — Encounter: Payer: Self-pay | Admitting: Internal Medicine

## 2020-04-16 ENCOUNTER — Inpatient Hospital Stay: Payer: 59

## 2020-04-16 ENCOUNTER — Other Ambulatory Visit: Payer: Self-pay

## 2020-04-16 ENCOUNTER — Inpatient Hospital Stay (HOSPITAL_BASED_OUTPATIENT_CLINIC_OR_DEPARTMENT_OTHER): Payer: 59 | Admitting: Internal Medicine

## 2020-04-16 VITALS — BP 127/59 | HR 68

## 2020-04-16 DIAGNOSIS — D500 Iron deficiency anemia secondary to blood loss (chronic): Secondary | ICD-10-CM

## 2020-04-16 DIAGNOSIS — N920 Excessive and frequent menstruation with regular cycle: Secondary | ICD-10-CM | POA: Diagnosis not present

## 2020-04-16 DIAGNOSIS — D509 Iron deficiency anemia, unspecified: Secondary | ICD-10-CM | POA: Diagnosis not present

## 2020-04-16 DIAGNOSIS — E611 Iron deficiency: Secondary | ICD-10-CM

## 2020-04-16 LAB — CBC WITH DIFFERENTIAL/PLATELET
Abs Immature Granulocytes: 0.04 K/uL (ref 0.00–0.07)
Basophils Absolute: 0.1 K/uL (ref 0.0–0.1)
Basophils Relative: 1 %
Eosinophils Absolute: 0.2 K/uL (ref 0.0–0.5)
Eosinophils Relative: 2 %
HCT: 36 % (ref 36.0–46.0)
Hemoglobin: 11.6 g/dL — ABNORMAL LOW (ref 12.0–15.0)
Immature Granulocytes: 0 %
Lymphocytes Relative: 23 %
Lymphs Abs: 2.4 K/uL (ref 0.7–4.0)
MCH: 23.7 pg — ABNORMAL LOW (ref 26.0–34.0)
MCHC: 32.2 g/dL (ref 30.0–36.0)
MCV: 73.5 fL — ABNORMAL LOW (ref 80.0–100.0)
Monocytes Absolute: 0.6 K/uL (ref 0.1–1.0)
Monocytes Relative: 6 %
Neutro Abs: 7.1 K/uL (ref 1.7–7.7)
Neutrophils Relative %: 68 %
Platelets: 426 K/uL — ABNORMAL HIGH (ref 150–400)
RBC: 4.9 MIL/uL (ref 3.87–5.11)
RDW: 24.7 % — ABNORMAL HIGH (ref 11.5–15.5)
WBC: 10.3 K/uL (ref 4.0–10.5)
nRBC: 0 % (ref 0.0–0.2)

## 2020-04-16 LAB — LACTATE DEHYDROGENASE: LDH: 160 U/L (ref 98–192)

## 2020-04-16 MED ORDER — SODIUM CHLORIDE 0.9 % IV SOLN: 200.0000 mg | Freq: Once | INTRAVENOUS | Status: DC

## 2020-04-16 MED ORDER — SODIUM CHLORIDE 0.9 % IV SOLN
Freq: Once | INTRAVENOUS | Status: AC
  Filled 2020-04-16: qty 250

## 2020-04-16 MED ORDER — IRON SUCROSE 20 MG/ML IV SOLN
200.0000 mg | Freq: Once | INTRAVENOUS | Status: AC
  Administered 2020-04-16: 200 mg via INTRAVENOUS
  Filled 2020-04-16: qty 10

## 2020-04-16 NOTE — Assessment & Plan Note (Addendum)
#  Severe anemia hemoglobin 8.3 [ferritin 5 saturation 1%]-likely secondary to menorrhagia [see below].  Continue p.o. iron.  S/p iron infusion hemoglobin improved at 11.6.  # Etiology- likely menorrhagia-improved on megace; Gynecology [[place]].   # Numbness of finger tip- Bil- ?  Carpal tunnel- ? Brace-defer to PCP.   # DISPOSITION: # Venofer today # follow up in 4 months; MD ;labs- cbc/bmp; iron studies/ferritin; possible venofer Dr.B

## 2020-04-18 NOTE — Progress Notes (Signed)
[facility] CONSULT NOTE  Patient Care Team: Billie Ruddy, MD as PCP - General (Family Medicine)  CHIEF COMPLAINTS/PURPOSE OF CONSULTATION: Anemia   HEMATOLOGY HISTORY  #Severe iron deficient anemia -hemoglobin 8.3 [iron saturation 1% ferritin 5; August 2021 PCP] s/p Venofer  #Menorrhagia [facility] gynecology Megace. [aug 2021]  HISTORY OF PRESENTING ILLNESS:  Mia Carlson 49 y.o.  female history of iron deficiency anemia likely secondary to menorrhagia is here for follow-up.  Patient admits to improvement of her fatigue status post IV iron infusion.  However continues to have shortness of breath on exertion.  Mild to moderate fatigue.  She is also been evaluated by gynecology placed on Megace for heavy menstrual cycles.  She complains of tingling and numbness in the bilateral upper extremities.  None in the lower extremities.  Review of Systems  Constitutional: Positive for malaise/fatigue. Negative for chills, diaphoresis, fever and weight loss.  HENT: Negative for nosebleeds and sore throat.   Eyes: Negative for double vision.  Respiratory: Positive for shortness of breath. Negative for cough, hemoptysis, sputum production and wheezing.   Cardiovascular: Negative for chest pain, palpitations, orthopnea and leg swelling.  Gastrointestinal: Negative for abdominal pain, blood in stool, constipation, diarrhea, heartburn, melena, nausea and vomiting.  Genitourinary: Negative for dysuria, frequency and urgency.  Musculoskeletal: Positive for joint pain. Negative for back pain.  Skin: Negative.  Negative for itching and rash.  Neurological: Negative for dizziness, tingling, focal weakness, weakness and headaches.  Endo/Heme/Allergies: Does not bruise/bleed easily.  Psychiatric/Behavioral: Negative for depression. The patient is not nervous/anxious and does not have insomnia.     MEDICAL HISTORY:  Past Medical History:  Diagnosis Date  . Anemia   .  Fibroids 03/06/2020  . History of low potassium   . Hypertension   . Low calcium levels     SURGICAL HISTORY: Past Surgical History:  Procedure Laterality Date  . FOOT SURGERY Right   . tubal      SOCIAL HISTORY: Social History   Socioeconomic History  . Marital status: Divorced    Spouse name: Not on file  . Number of children: Not on file  . Years of education: Not on file  . Highest education level: Not on file  Occupational History  . Not on file  Tobacco Use  . Smoking status: Never Smoker  . Smokeless tobacco: Never Used  Vaping Use  . Vaping Use: Never used  Substance and Sexual Activity  . Alcohol use: Yes    Comment: glass of wine at night  . Drug use: No  . Sexual activity: Not Currently    Birth control/protection: Surgical    Comment: tubal  Other Topics Concern  . Not on file  Social History Narrative   Lives in [place] [used in live in [place]]; WL/sanitation-OR; no smoking; wine.    Social Determinants of Health   Financial Resource Strain:   . Difficulty of Paying Living Expenses: Not on file  Food Insecurity:   . Worried About Charity fundraiser in the Last Year: Not on file  . Ran Out of Food in the Last Year: Not on file  Transportation Needs:   . Lack of Transportation (Medical): Not on file  . Lack of Transportation (Non-Medical): Not on file  Physical Activity:   . Days of Exercise per Week: Not on file  . Minutes of Exercise per Session: Not on file  Stress:   . Feeling of Stress : Not on file  Social  Connections:   . Frequency of Communication with Friends and Family: Not on file  . Frequency of Social Gatherings with Friends and Family: Not on file  . Attends Religious Services: Not on file  . Active Member of Clubs or Organizations: Not on file  . Attends Archivist Meetings: Not on file  . Marital Status: Not on file  Intimate Partner Violence:   . Fear of Current or Ex-Partner: Not on file  . Emotionally Abused:  Not on file  . Physically Abused: Not on file  . Sexually Abused: Not on file    FAMILY HISTORY: Family History  Problem Relation Age of Onset  . Hypertension Paternal Grandfather   . Heart attack Father     ALLERGIES:  is allergic to eggs or egg-derived products.  MEDICATIONS:  Current Outpatient Medications  Medication Sig Dispense Refill  . Albuterol Sulfate (PROAIR RESPICLICK) 678 (90 Base) MCG/ACT AEPB Inhale 2 puffs into the lungs every 6 (six) hours as needed. 1 each 1  . amLODipine (NORVASC) 10 MG tablet TAKE 1 TABLET BY MOUTH ONCE A DAY 90 tablet 2  . ferrous sulfate 325 (65 FE) MG tablet Take 1 tablet (325 mg total) by mouth daily. 30 tablet 0  . megestrol (MEGACE) 40 MG tablet 3 tablets a day for 5 days, 2 tablets a day for 5 days then 1 tablet daily 45 tablet 3  . triamterene-hydrochlorothiazide (MAXZIDE-25) 37.5-25 MG tablet TAKE 1 TABLET BY MOUTH ONCE DAILY 90 tablet 1  . Vitamin D, Ergocalciferol, (DRISDOL) 1.25 MG (50000 UNIT) CAPS capsule Take 1 capsule (50,000 Units total) by mouth every 7 (seven) days. 12 capsule 0  . gabapentin (NEURONTIN) 100 MG capsule Take 2 capsules (200 mg total) by mouth 3 (three) times daily. 180 capsule 1   No current facility-administered medications for this visit.      PHYSICAL EXAMINATION:   Vitals:   04/16/20 1303  BP: (!) 154/80  Pulse: 77  Resp: 18  Temp: 98 F (36.7 C)  SpO2: 100%   Filed Weights   04/16/20 1303  Weight: 240 lb (108.9 kg)    Physical Exam Constitutional:      Comments: Accompanied by her aunt.  HENT:     Head: Normocephalic and atraumatic.     Mouth/Throat:     Pharynx: No oropharyngeal exudate.  Eyes:     Pupils: Pupils are equal, round, and reactive to light.  Cardiovascular:     Rate and Rhythm: Normal rate and regular rhythm.  Pulmonary:     Effort: Pulmonary effort is normal. No respiratory distress.     Breath sounds: Normal breath sounds. No wheezing.  Abdominal:     General:  Bowel sounds are normal. There is no distension.     Palpations: Abdomen is soft. There is no mass.     Tenderness: There is no abdominal tenderness. There is no guarding or rebound.  Musculoskeletal:        General: No tenderness. Normal range of motion.     Cervical back: Normal range of motion and neck supple.  Skin:    General: Skin is warm.  Neurological:     Mental Status: She is alert and oriented to person, place, and time.  Psychiatric:        Mood and Affect: Affect normal.     LABORATORY DATA:  I have reviewed the data as listed Lab Results  Component Value Date   WBC 10.3 04/16/2020   HGB 11.6 (L)  04/16/2020   HCT 36.0 04/16/2020   MCV 73.5 (L) 04/16/2020   PLT 426 (H) 04/16/2020   Recent Labs    02/16/20 2244 02/27/20 1034  NA 139 136  K 3.3* 3.9  CL 101 102  CO2 26 26  GLUCOSE 99 96  BUN 10 13  CREATININE 0.83 0.93  CALCIUM 8.8* 9.1  GFRNONAA >60 72  GFRAA >60 84     DG Knee Complete 4 Views Left  Result Date: 04/01/2020 CLINICAL DATA:  Left knee pain and swelling for the past month. No injury. EXAM: LEFT KNEE - COMPLETE 4+ VIEW COMPARISON:  None. FINDINGS: No acute fracture or dislocation. Trace joint effusion. Mild medial compartment joint space narrowing. Small tricompartmental marginal osteophytes. Bone mineralization is normal. Soft tissues are unremarkable. IMPRESSION: 1. Mild tricompartmental osteoarthritis. Electronically Signed   By: Titus Dubin M.D.   On: 04/01/2020 15:50    Iron deficiency #Severe anemia hemoglobin 8.3 [ferritin 5 saturation 1%]-likely secondary to menorrhagia [see below].  Continue p.o. iron.  S/p iron infusion hemoglobin improved at 11.6.  # Etiology- likely menorrhagia-improved on megace; Gynecology [[place]].   # Numbness of finger tip- Bil- ?  Carpal tunnel- ? Brace-defer to PCP.   # DISPOSITION: # Venofer today # follow up in 4 months; MD ;labs- cbc/bmp; iron studies/ferritin; possible venofer  Dr.B     All questions were answered. The patient knows to call the clinic with any problems, questions or concerns.      Cammie Sickle, MD 04/18/2020 3:48 PM

## 2020-05-14 MED FILL — MEGESTROL 40 MG TABLET: 40 | 30 days supply | Qty: 45 | Fill #2

## 2020-06-16 MED FILL — MEGESTROL 40 MG TABLET: 40 | 30 days supply | Qty: 45 | Fill #3

## 2020-06-20 ENCOUNTER — Telehealth (INDEPENDENT_AMBULATORY_CARE_PROVIDER_SITE_OTHER): Payer: 59 | Admitting: Obstetrics & Gynecology

## 2020-06-20 ENCOUNTER — Encounter: Payer: Self-pay | Admitting: Obstetrics & Gynecology

## 2020-06-20 ENCOUNTER — Other Ambulatory Visit: Payer: Self-pay

## 2020-06-20 DIAGNOSIS — D219 Benign neoplasm of connective and other soft tissue, unspecified: Secondary | ICD-10-CM

## 2020-06-20 DIAGNOSIS — N920 Excessive and frequent menstruation with regular cycle: Secondary | ICD-10-CM

## 2020-06-20 DIAGNOSIS — N946 Dysmenorrhea, unspecified: Secondary | ICD-10-CM

## 2020-06-20 NOTE — Progress Notes (Addendum)
   TELEHEALTH VIRTUAL GYNECOLOGY VISIT ENCOUNTER NOTE I am in office, pt is in home I connected with [name] on 06/20/20 at 12:50 PM EST by telephone at home and verified that I am speaking with the correct person using two identifiers.   I discussed the limitations, risks, security and privacy concerns of performing an evaluation and management service by telephone and the availability of in person appointments. I also discussed with the patient that there may be a patient responsible charge related to this service. The patient expressed understanding and agreed to proceed.   History:  Mia Carlson is a 49 y.o. [phone] female being evaluated today for response to megestrol with menometrorrhagia and fibroids. She denies any abnormal vaginal discharge, bleeding, pelvic pain or other concerns.     Bleeding is much much lighter but continues essentially daily She is going to continue the megstrol We discussed Mirena IUD in the past and she would like to do that She will continue the megestrol   Past Medical History:  Diagnosis Date  . Anemia   . Fibroids 03/06/2020  . History of low potassium   . Hypertension   . Low calcium levels    Past Surgical History:  Procedure Laterality Date  . FOOT SURGERY Right   . tubal     The following portions of the patient's history were reviewed and updated as appropriate: allergies, current medications, past family history, past medical history, past social history, past surgical history and problem list.     Review of Systems:  Pertinent items noted in HPI and remainder of comprehensive ROS otherwise negative.  Physical Exam:  Physical exam deferred due to nature of the encounter  Labs and Imaging No results found for this or any previous visit (from the past 336 hour(s)). No results found.     No orders of the defined types were placed in this encounter.   No orders of the defined types were placed in this  encounter.   Assessment and Plan:       ICD-10-CM   1. Menorrhagia with regular cycle  N92.0   2. Fibroids  D21.9   3. Dysmenorrhea  N94.6   continue megestol Schedule placement of Mirena after th first of the year       I discussed the assessment and treatment plan with the patient. The patient was provided an opportunity to ask questions and all were answered. The patient agreed with the plan and demonstrated an understanding of the instructions.   The patient was advised to call back or seek an in-person evaluation/go to the ED if the symptoms worsen or if the condition fails to improve as anticipated.  I provided 10 minutes of non-face-to-face time during this encounter.   Florian Buff, [facility] for [facility]

## 2020-07-02 ENCOUNTER — Ambulatory Visit: Payer: 59 | Admitting: Family Medicine

## 2020-07-14 ENCOUNTER — Other Ambulatory Visit: Payer: Self-pay | Admitting: Obstetrics & Gynecology

## 2020-07-14 ENCOUNTER — Telehealth: Payer: Self-pay | Admitting: Obstetrics & Gynecology

## 2020-07-14 MED FILL — MEGESTROL 40 MG TABLET: 40 | 45 days supply | Qty: 45 | Fill #0

## 2020-07-14 NOTE — Telephone Encounter (Signed)
Pt is supposed to come in on 07/21/2020 for IUD insertion but states she's started bleeding heavily & has had to change clothes multiple times  Please advise    Mia Carlson Outpt pharmacy

## 2020-07-14 NOTE — Telephone Encounter (Signed)
Pt is supposed to get IUD on Monday. She started bleeding on Thursday. Bleeding stopped and started back today. Pt is taking Megace once a day. Today has had to change clothes 3 times. Please advise. Thanks!! JSY

## 2020-07-14 NOTE — Telephone Encounter (Signed)
Pt advised to take 3 Megace a day and keep IUD appt. Pt voiced understanding. Stuart

## 2020-07-14 NOTE — Telephone Encounter (Signed)
Increase megestrol to 3 a day and keep IUD placement appt

## 2020-07-21 ENCOUNTER — Encounter: Payer: Self-pay | Admitting: Obstetrics & Gynecology

## 2020-07-21 ENCOUNTER — Ambulatory Visit (INDEPENDENT_AMBULATORY_CARE_PROVIDER_SITE_OTHER): Payer: 59 | Admitting: Obstetrics & Gynecology

## 2020-07-21 ENCOUNTER — Other Ambulatory Visit: Payer: Self-pay

## 2020-07-21 VITALS — BP 118/82 | HR 97 | Ht 63.0 in | Wt 244.0 lb

## 2020-07-21 DIAGNOSIS — N921 Excessive and frequent menstruation with irregular cycle: Secondary | ICD-10-CM

## 2020-07-21 DIAGNOSIS — D219 Benign neoplasm of connective and other soft tissue, unspecified: Secondary | ICD-10-CM

## 2020-07-21 MED ORDER — LEVONORGESTREL 20 MCG/24HR IU IUD: INTRAUTERINE_SYSTEM | Freq: Once | INTRAUTERINE | Status: AC

## 2020-07-21 NOTE — Progress Notes (Signed)
IUD Insertion Procedure Note  Pre-operative Diagnosis:    ICD-10-CM   1. Menometrorrhagia  N92.1   2. Fibroids  D21.9      Post-operative Diagnosis: same  Indications: contraception  Procedure Details  Urine pregnancy test was not done.  The risks (including infection, bleeding, pain, and uterine perforation) and benefits of the procedure were explained to the patient and Written informed consent was obtained.    Cervix cleansed with Betadine. Uterus sounded to 10 cm. IUD inserted without difficulty. String visible and trimmed. Patient tolerated procedure well.  IUD Information: Mirena, Lot # [id number], Expiration date 04/2022.  Condition: Stable  Complications: None  Plan:  The patient was advised to call for any fever or for prolonged or severe pain or bleeding. She was advised to use OTC analgesics as needed for mild to moderate pain.   Attending Physician Documentation: I performed the placement

## 2020-07-23 ENCOUNTER — Other Ambulatory Visit: Payer: Self-pay

## 2020-07-24 ENCOUNTER — Ambulatory Visit (INDEPENDENT_AMBULATORY_CARE_PROVIDER_SITE_OTHER): Payer: 59 | Admitting: Family Medicine

## 2020-07-24 ENCOUNTER — Encounter: Payer: Self-pay | Admitting: Family Medicine

## 2020-07-24 VITALS — BP 142/86 | HR 88 | Temp 98.4°F | Wt 248.0 lb

## 2020-07-24 DIAGNOSIS — E559 Vitamin D deficiency, unspecified: Secondary | ICD-10-CM

## 2020-07-24 DIAGNOSIS — M79641 Pain in right hand: Secondary | ICD-10-CM | POA: Diagnosis not present

## 2020-07-24 DIAGNOSIS — M79671 Pain in right foot: Secondary | ICD-10-CM | POA: Diagnosis not present

## 2020-07-24 DIAGNOSIS — M545 Low back pain, unspecified: Secondary | ICD-10-CM

## 2020-07-24 DIAGNOSIS — M79642 Pain in left hand: Secondary | ICD-10-CM

## 2020-07-24 DIAGNOSIS — D500 Iron deficiency anemia secondary to blood loss (chronic): Secondary | ICD-10-CM | POA: Diagnosis not present

## 2020-07-24 DIAGNOSIS — G8929 Other chronic pain: Secondary | ICD-10-CM

## 2020-07-24 DIAGNOSIS — G629 Polyneuropathy, unspecified: Secondary | ICD-10-CM | POA: Diagnosis not present

## 2020-07-24 NOTE — Patient Instructions (Signed)
Preventing Carpal Tunnel Syndrome  Carpal tunnel syndrome is a condition that causes pain, numbness, and weakness in the wrist, hand, and fingers. The carpal tunnel is a narrow, hollow space in the wrist. Tendons and one of the main nerves in the hand (median nerve) pass through the carpal tunnel. The median nerve supplies feeling to the thumb and the first three fingers. It also supplies the muscles at the base of the thumb. Carpal tunnel syndrome happens when the median nerve gets squeezed in the area where it passes through the carpal tunnel. In some cases, it may not be possible to prevent carpal tunnel syndrome. However, you can take steps to relieve pressure on your wrist and reduce your risk of developing this condition. How can this condition affect me? Carpal tunnel syndrome can affect your ability to do jobs or activities that involve hand, wrist, and finger action. It can cause symptoms such as:  Pain in the wrist, hand, and fingers.  Burning, tingling, or numbness in the affected area.  A weak feeling in your hands. You may have trouble grabbing and holding items. Symptoms may get worse over time. For some people, symptoms get worse at night. What can increase my risk? The following factors may make you more likely to develop this condition:  Having a job that requires you to repeatedly move your wrist or requires you to use tools that vibrate. This may include jobs that involve using computers, working on an [employer], or working with [employer] such as Pension scheme manager.  Being a woman.  Having a family history of the condition.  Having certain conditions, such as: ? Diabetes. ? Pregnancy. ? Obesity. ? Thyroid disease. ? Rheumatoid arthritis. What actions can I take to help prevent this condition?      Avoid making repetitive hand and wrist motions that cause your wrist to get stiff or painful.  Take frequent breaks if you use your hands and wrists for many hours  at a time.  Stretch your hands and fingers often to get blood flowing and relieve tension.  Keep your wrists in the natural position when using a computer keyboard or mouse. Do not bend your wrists downward or sideways.  If you use your hands and wrists for many hours at work, make changes to your work space to ease pressure on your wrists. You may want to use: ? A padded wrist rest for computer work. ? A slanted computer keyboard. ? Hand tools with padded handles to reduce vibrations.  Consider wearing a wrist brace. This will not prevent carpal tunnel syndrome but may keep it from getting worse. A wrist brace reduces bending and stress.  Closely manage any medical conditions you have that can put you at risk for carpal tunnel syndrome. Have your blood sugar checked to make sure you are not developing diabetes. If you have diabetes, work with your health care provider to keep your blood sugar under control. Where to find more information  [employer] of Neurological Disorders and Stroke: [email or web address]  [place] [facility]: [email or web address] Contact a health care provider if:  You have numbness or tingling in your wrist, hand, or fingers.  You have pain or a burning sensation in your wrist, hand, or fingers.  Pain, tingling, or burning wakes you up at night.  Your hand becomes weak and clumsy.  You frequently drop objects.  You are unable to use your wrists and hands without pain. Summary  Carpal tunnel syndrome is  a condition that causes pain, numbness, and weakness in the wrist, hand, and fingers.  You can take steps to relieve pressure on your wrist and reduce your risk of developing this condition.  Avoid making repetitive hand and wrist motions that cause your wrist to get stiff or painful.  If you use your hands and wrists for many hours at work, you may want to make changes to your work space to ease pressure on your wrists.  Take frequent  breaks to stretch your hands and fingers. This information is not intended to replace advice given to you by your health care provider. Make sure you discuss any questions you have with your health care provider. Document Revised: 11/17/2017 Document Reviewed: 11/17/2017 Elsevier Patient Education  [place].  Preventing Carpal Tunnel Syndrome  Carpal tunnel syndrome is a condition that causes pain, numbness, and weakness in the wrist, hand, and fingers. The carpal tunnel is a narrow, hollow space in the wrist. Tendons and one of the main nerves in the hand (median nerve) pass through the carpal tunnel. The median nerve supplies feeling to the thumb and the first three fingers. It also supplies the muscles at the base of the thumb. Carpal tunnel syndrome happens when the median nerve gets squeezed in the area where it passes through the carpal tunnel. In some cases, it may not be possible to prevent carpal tunnel syndrome. However, you can take steps to relieve pressure on your wrist and reduce your risk of developing this condition. How can this condition affect me? Carpal tunnel syndrome can affect your ability to do jobs or activities that involve hand, wrist, and finger action. It can cause symptoms such as:  Pain in the wrist, hand, and fingers.  Burning, tingling, or numbness in the affected area.  A weak feeling in your hands. You may have trouble grabbing and holding items. Symptoms may get worse over time. For some people, symptoms get worse at night. What can increase my risk? The following factors may make you more likely to develop this condition:  Having a job that requires you to repeatedly move your wrist or requires you to use tools that vibrate. This may include jobs that involve using computers, working on an [employer], or working with [place] such as Pension scheme manager.  Being a woman.  Having a family history of the condition.  Having certain conditions,  such as: ? Diabetes. ? Pregnancy. ? Obesity. ? Thyroid disease. ? Rheumatoid arthritis. What actions can I take to help prevent this condition?      Avoid making repetitive hand and wrist motions that cause your wrist to get stiff or painful.  Take frequent breaks if you use your hands and wrists for many hours at a time.  Stretch your hands and fingers often to get blood flowing and relieve tension.  Keep your wrists in the natural position when using a computer keyboard or mouse. Do not bend your wrists downward or sideways.  If you use your hands and wrists for many hours at work, make changes to your work space to ease pressure on your wrists. You may want to use: ? A padded wrist rest for computer work. ? A slanted computer keyboard. ? Hand tools with padded handles to reduce vibrations.  Consider wearing a wrist brace. This will not prevent carpal tunnel syndrome but may keep it from getting worse. A wrist brace reduces bending and stress.  Closely manage any medical conditions you have  that can put you at risk for carpal tunnel syndrome. Have your blood sugar checked to make sure you are not developing diabetes. If you have diabetes, work with your health care provider to keep your blood sugar under control. Where to find more information  [employer] of Neurological Disorders and Stroke: [email or web address]  [employer] of Family Physicians: [email or web address] Contact a health care provider if:  You have numbness or tingling in your wrist, hand, or fingers.  You have pain or a burning sensation in your wrist, hand, or fingers.  Pain, tingling, or burning wakes you up at night.  Your hand becomes weak and clumsy.  You frequently drop objects.  You are unable to use your wrists and hands without pain. Summary  Carpal tunnel syndrome is a condition that causes pain, numbness, and weakness in the wrist, hand, and fingers.  You can take steps to relieve  pressure on your wrist and reduce your risk of developing this condition.  Avoid making repetitive hand and wrist motions that cause your wrist to get stiff or painful.  If you use your hands and wrists for many hours at work, you may want to make changes to your work space to ease pressure on your wrists.  Take frequent breaks to stretch your hands and fingers. This information is not intended to replace advice given to you by your health care provider. Make sure you discuss any questions you have with your health care provider. Document Revised: 11/17/2017 Document Reviewed: 11/17/2017 Elsevier Patient Education  [place] Syndrome  Carpal tunnel syndrome is a condition that causes pain in your hand and arm. The carpal tunnel is a narrow area located on the palm side of your wrist. Repeated wrist motion or certain diseases may cause swelling within the tunnel. This swelling pinches the main nerve in the wrist (median nerve). What are the causes? This condition may be caused by:  Repeated wrist motions.  Wrist injuries.  Arthritis.  A cyst or tumor in the carpal tunnel.  Fluid buildup during pregnancy. Sometimes the cause of this condition is not known. What increases the risk? The following factors may make you more likely to develop this condition:  Having a job, such as being a Research scientist (life sciences), that requires you to repeatedly move your wrist in the same motion.  Being a woman.  Having certain conditions, such as: ? Diabetes. ? Obesity. ? An underactive thyroid (hypothyroidism). ? Kidney failure. What are the signs or symptoms? Symptoms of this condition include:  A tingling feeling in your fingers, especially in your thumb, index, and middle fingers.  Tingling or numbness in your hand.  An aching feeling in your entire arm, especially when your wrist and elbow are bent for a long time.  Wrist pain that goes up your arm to your  shoulder.  Pain that goes down into your palm or fingers.  A weak feeling in your hands. You may have trouble grabbing and holding items. Your symptoms may feel worse during the night. How is this diagnosed? This condition is diagnosed with a medical history and physical exam. You may also have tests, including:  Electromyogram (EMG). This test measures electrical signals sent by your nerves into the muscles.  Nerve conduction study. This test measures how well electrical signals pass through your nerves.  Imaging tests, such as X-rays, ultrasound, and MRI. These tests check for possible causes of your condition. How is this treated? This condition may  be treated with:  Lifestyle changes. It is important to stop or change the activity that caused your condition.  Doing exercise and activities to strengthen your muscles and bones (physical therapy).  Learning how to use your hand again after diagnosis (occupational therapy).  Medicines for pain and inflammation. This may include medicine that is injected into your wrist.  A wrist splint.  Surgery. Follow these instructions at home: If you have a splint:  Wear the splint as told by your health care provider. Remove it only as told by your health care provider.  Loosen the splint if your fingers tingle, become numb, or turn cold and blue.  Keep the splint clean.  If the splint is not waterproof: ? Do not let it get wet. ? Cover it with a watertight covering when you take a bath or shower. Managing pain, stiffness, and swelling   If directed, put ice on the painful area: ? If you have a removable splint, remove it as told by your health care provider. ? Put ice in a plastic bag. ? Place a towel between your skin and the bag. ? Leave the ice on for 20 minutes, 2-3 times per day. General instructions  Take over-the-counter and prescription medicines only as told by your health care provider.  Rest your wrist from any  activity that may be causing your pain. If your condition is work related, talk with your employer about changes that can be made, such as getting a wrist pad to use while typing.  Do any exercises as told by your health care provider, physical therapist, or occupational therapist.  Keep all follow-up visits as told by your health care provider. This is important. Contact a health care provider if:  You have new symptoms.  Your pain is not controlled with medicines.  Your symptoms get worse. Get help right away if:  You have severe numbness or tingling in your wrist or hand. Summary  Carpal tunnel syndrome is a condition that causes pain in your hand and arm.  It is usually caused by repeated wrist motions.  Lifestyle changes and medicines are used to treat carpal tunnel syndrome. Surgery may be recommended.  Follow your health care provider's instructions about wearing a splint, resting from activity, keeping follow-up visits, and calling for help. This information is not intended to replace advice given to you by your health care provider. Make sure you discuss any questions you have with your health care provider. Document Revised: 11/11/2017 Document Reviewed: 11/11/2017 Elsevier Patient Education  2020 [employer].
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
## 2020-07-25 ENCOUNTER — Other Ambulatory Visit: Payer: Self-pay | Admitting: Family Medicine

## 2020-07-25 DIAGNOSIS — E559 Vitamin D deficiency, unspecified: Secondary | ICD-10-CM

## 2020-07-25 LAB — VITAMIN D 25 HYDROXY (VIT D DEFICIENCY, FRACTURES): VITD: 27.95 ng/mL — ABNORMAL LOW (ref 30.00–100.00)

## 2020-07-25 LAB — FOLATE: Folate: >23.6 ng/mL (ref >5.9)

## 2020-07-25 LAB — VITAMIN B12: Vitamin B-12: >1526 pg/mL — ABNORMAL HIGH (ref 211–911)

## 2020-07-25 MED ORDER — VITAMIN D (ERGOCALCIFEROL) 1.25 MG (50000 UNIT) PO CAPS: 50000.0000 [IU] | ORAL_CAPSULE | ORAL | 0 refills | Status: DC

## 2020-07-25 MED FILL — VIT D2 1.25 MG (50,000 UNIT: 1.25 MG | 84 days supply | Qty: 12 | Fill #0

## 2020-07-31 ENCOUNTER — Other Ambulatory Visit: Payer: Self-pay | Admitting: Obstetrics & Gynecology

## 2020-07-31 ENCOUNTER — Other Ambulatory Visit: Payer: Self-pay | Admitting: *Deleted

## 2020-07-31 MED ORDER — MEGESTROL ACETATE 40 MG PO TABS
ORAL_TABLET | ORAL | 3 refills | Status: DC
Start: 2020-07-31 — End: 2020-07-31

## 2020-08-05 MED FILL — TRIAMTERENE-HCTZ 37.5-25 MG: 37.5-25 | 90 days supply | Qty: 90 | Fill #1

## 2020-08-05 MED FILL — AMLODIPINE BESYLATE 10 MG T: 10 | 90 days supply | Qty: 90 | Fill #2

## 2020-08-06 MED FILL — MEGESTROL 40 MG TABLET: 40 | 30 days supply | Qty: 45 | Fill #0
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
## 2020-08-10 ENCOUNTER — Encounter: Payer: Self-pay | Admitting: Family Medicine

## 2020-08-13 ENCOUNTER — Inpatient Hospital Stay (HOSPITAL_BASED_OUTPATIENT_CLINIC_OR_DEPARTMENT_OTHER): Payer: 59 | Admitting: Internal Medicine

## 2020-08-13 ENCOUNTER — Other Ambulatory Visit: Payer: Self-pay

## 2020-08-13 ENCOUNTER — Inpatient Hospital Stay: Payer: 59

## 2020-08-13 ENCOUNTER — Inpatient Hospital Stay: Payer: 59 | Attending: Internal Medicine

## 2020-08-13 DIAGNOSIS — Z79899 Other long term (current) drug therapy: Secondary | ICD-10-CM | POA: Diagnosis not present

## 2020-08-13 DIAGNOSIS — I10 Essential (primary) hypertension: Secondary | ICD-10-CM | POA: Diagnosis not present

## 2020-08-13 DIAGNOSIS — E611 Iron deficiency: Secondary | ICD-10-CM | POA: Diagnosis not present

## 2020-08-13 DIAGNOSIS — D509 Iron deficiency anemia, unspecified: Secondary | ICD-10-CM | POA: Insufficient documentation

## 2020-08-13 LAB — CBC WITH DIFFERENTIAL/PLATELET
Abs Immature Granulocytes: 0.02 10*3/uL (ref 0.00–0.07)
Basophils Absolute: 0 10*3/uL (ref 0.0–0.1)
Basophils Relative: 1 %
Eosinophils Absolute: 0.2 10*3/uL (ref 0.0–0.5)
Eosinophils Relative: 3 %
HCT: 36.6 % (ref 36.0–46.0)
Hemoglobin: 12 g/dL (ref 12.0–15.0)
Immature Granulocytes: 0 %
Lymphocytes Relative: 29 %
Lymphs Abs: 2.3 10*3/uL (ref 0.7–4.0)
MCH: 26.7 pg (ref 26.0–34.0)
MCHC: 32.8 g/dL (ref 30.0–36.0)
MCV: 81.3 fL (ref 80.0–100.0)
Monocytes Absolute: 0.4 10*3/uL (ref 0.1–1.0)
Monocytes Relative: 6 %
Neutro Abs: 5 10*3/uL (ref 1.7–7.7)
Neutrophils Relative %: 61 %
Platelets: 445 10*3/uL — ABNORMAL HIGH (ref 150–400)
RBC: 4.5 MIL/uL (ref 3.87–5.11)
RDW: 14.4 % (ref 11.5–15.5)
WBC: 8.1 10*3/uL (ref 4.0–10.5)
nRBC: 0 % (ref 0.0–0.2)

## 2020-08-13 LAB — IRON AND TIBC
Iron: 23 ug/dL — ABNORMAL LOW (ref 28–170)
Saturation Ratios: 5 % — ABNORMAL LOW (ref 10.4–31.8)
TIBC: 470 ug/dL — ABNORMAL HIGH (ref 250–450)
UIBC: 447 ug/dL

## 2020-08-13 LAB — COMPREHENSIVE METABOLIC PANEL
ALT: 19 U/L (ref 0–44)
AST: 19 U/L (ref 15–41)
Albumin: 3.6 g/dL (ref 3.5–5.0)
Alkaline Phosphatase: 64 U/L (ref 38–126)
Anion gap: 6 (ref 5–15)
BUN: 10 mg/dL (ref 6–20)
CO2: 26 mmol/L (ref 22–32)
Calcium: 8.8 mg/dL — ABNORMAL LOW (ref 8.9–10.3)
Chloride: 105 mmol/L (ref 98–111)
Creatinine, Ser: 0.74 mg/dL (ref 0.44–1.00)
GFR, Estimated: >60 mL/min (ref >60)
Glucose, Bld: 98 mg/dL (ref 70–99)
Potassium: 3.6 mmol/L (ref 3.5–5.1)
Sodium: 137 mmol/L (ref 135–145)
Total Bilirubin: 0.7 mg/dL (ref 0.3–1.2)
Total Protein: 7.5 g/dL (ref 6.5–8.1)

## 2020-08-13 LAB — FERRITIN: Ferritin: 8 ng/mL — ABNORMAL LOW (ref 11–307)

## 2020-08-13 NOTE — Progress Notes (Signed)
[facility] CONSULT NOTE  Patient Care Team: Billie Ruddy, MD as PCP - General (Family Medicine)  CHIEF COMPLAINTS/PURPOSE OF CONSULTATION: Anemia   HEMATOLOGY HISTORY  #Severe iron deficient anemia -hemoglobin 8.3 [iron saturation 1% ferritin 5; August 2021 PCP] s/p Venofer  #Menorrhagia [facility] gynecology Megace. [aug 2021]; IUD  HISTORY OF PRESENTING ILLNESS:  Mia Carlson 50 y.o.  female history of iron deficiency anemia likely secondary to menorrhagia is here for follow-up.  Patient states her menstrual blood flow has improved since she had IUD placed.  She continues on Megace as per GYN.  Denies any worsening fatigue.  Denies any shortness of breath or cough.  No blood in stools or black-colored stools.  Review of Systems  Constitutional: Negative for chills, diaphoresis, fever and weight loss.  HENT: Negative for nosebleeds and sore throat.   Eyes: Negative for double vision.  Respiratory: Negative for cough, hemoptysis, sputum production and wheezing.   Cardiovascular: Negative for chest pain, palpitations, orthopnea and leg swelling.  Gastrointestinal: Negative for abdominal pain, blood in stool, constipation, diarrhea, heartburn, melena, nausea and vomiting.  Genitourinary: Negative for dysuria, frequency and urgency.  Musculoskeletal: Positive for joint pain. Negative for back pain.  Skin: Negative.  Negative for itching and rash.  Neurological: Negative for dizziness, tingling, focal weakness, weakness and headaches.  Endo/Heme/Allergies: Does not bruise/bleed easily.  Psychiatric/Behavioral: Negative for depression. The patient is not nervous/anxious and does not have insomnia.     MEDICAL HISTORY:  Past Medical History:  Diagnosis Date  . Anemia   . Fibroids 03/06/2020  . History of low potassium   . Hypertension   . Low calcium levels     SURGICAL HISTORY: Past Surgical History:  Procedure Laterality Date  . FOOT SURGERY  Right   . tubal      SOCIAL HISTORY: Social History   Socioeconomic History  . Marital status: Divorced    Spouse name: Not on file  . Number of children: Not on file  . Years of education: Not on file  . Highest education level: Not on file  Occupational History  . Not on file  Tobacco Use  . Smoking status: Never Smoker  . Smokeless tobacco: Never Used  Vaping Use  . Vaping Use: Never used  Substance and Sexual Activity  . Alcohol use: Yes    Comment: glass of wine at night  . Drug use: No  . Sexual activity: Not Currently    Birth control/protection: Surgical    Comment: tubal  Other Topics Concern  . Not on file  Social History Narrative   Lives in [place] [used in live in [place]]; WL/sanitation-OR; no smoking; wine.    Social Determinants of Health   Financial Resource Strain: Not on file  Food Insecurity: Not on file  Transportation Needs: Not on file  Physical Activity: Not on file  Stress: Not on file  Social Connections: Not on file  Intimate Partner Violence: Not on file    FAMILY HISTORY: Family History  Problem Relation Age of Onset  . Hypertension Paternal Grandfather   . Heart attack Father     ALLERGIES:  is allergic to eggs or egg-derived products.  MEDICATIONS:  Current Outpatient Medications  Medication Sig Dispense Refill  . Albuterol Sulfate (PROAIR RESPICLICK) 952 (90 Base) MCG/ACT AEPB Inhale 2 puffs into the lungs every 6 (six) hours as needed. 1 each 1  . amLODipine (NORVASC) 10 MG tablet TAKE 1 TABLET BY MOUTH ONCE A DAY  90 tablet 2  . ferrous sulfate 325 (65 FE) MG tablet Take 1 tablet (325 mg total) by mouth daily. 30 tablet 0  . megestrol (MEGACE) 40 MG tablet TAKE 3 TABLETS BY MOUTH FOR 5 DAYS, 2 TABLETS BY MOUTH FOR 5 DAYS THEN 1 TABLET DAILY (Patient taking differently: Take 40 mg by mouth daily. TAKE 3 TABLETS BY MOUTH FOR 5 DAYS, 2 TABLETS BY MOUTH FOR 5 DAYS THEN 1 TABLET DAILY) 45 tablet 3  .  triamterene-hydrochlorothiazide (MAXZIDE-25) 37.5-25 MG tablet TAKE 1 TABLET BY MOUTH ONCE DAILY 90 tablet 1  . Vitamin D, Ergocalciferol, (DRISDOL) 1.25 MG (50000 UNIT) CAPS capsule Take 1 capsule (50,000 Units total) by mouth every 7 (seven) days. 12 capsule 0   No current facility-administered medications for this visit.      PHYSICAL EXAMINATION:   Vitals:   08/13/20 1300  BP: (!) 164/99  Pulse: 71  Resp: 20  Temp: 97.9 F (36.6 C)   Filed Weights   08/13/20 1300  Weight: 248 lb (112.5 kg)    Physical Exam Constitutional:      Comments: Accompanied by her aunt.  HENT:     Head: Normocephalic and atraumatic.     Mouth/Throat:     Pharynx: No oropharyngeal exudate.  Eyes:     Pupils: Pupils are equal, round, and reactive to light.  Cardiovascular:     Rate and Rhythm: Normal rate and regular rhythm.  Pulmonary:     Effort: Pulmonary effort is normal. No respiratory distress.     Breath sounds: Normal breath sounds. No wheezing.  Abdominal:     General: Bowel sounds are normal. There is no distension.     Palpations: Abdomen is soft. There is no mass.     Tenderness: There is no abdominal tenderness. There is no guarding or rebound.  Musculoskeletal:        General: No tenderness. Normal range of motion.     Cervical back: Normal range of motion and neck supple.  Skin:    General: Skin is warm.  Neurological:     Mental Status: She is alert and oriented to person, place, and time.  Psychiatric:        Mood and Affect: Affect normal.     LABORATORY DATA:  I have reviewed the data as listed Lab Results  Component Value Date   WBC 8.1 08/13/2020   HGB 12.0 08/13/2020   HCT 36.6 08/13/2020   MCV 81.3 08/13/2020   PLT 445 (H) 08/13/2020   Recent Labs    02/16/20 2244 02/27/20 1034 08/13/20 1300  NA 139 136 137  K 3.3* 3.9 3.6  CL 101 102 105  CO2 26 26 26   GLUCOSE 99 96 98  BUN 10 13 10   CREATININE 0.83 0.93 0.74  CALCIUM 8.8* 9.1 8.8*   GFRNONAA >60 72 >60  GFRAA >60 84  --   PROT  --   --  7.5  ALBUMIN  --   --  3.6  AST  --   --  19  ALT  --   --  19  ALKPHOS  --   --  64  BILITOT  --   --  0.7     No results found.  Iron deficiency #Severe anemia hemoglobin 8.3 [ferritin 5 saturation 1%]-likely secondary to menorrhagia [see below].  Continue p.o. iron.  S/p iron infusion hemoglobin improved at 12.0; HOLD iron infusion-especially as menorrhagia is improved; as patient on p.o. iron.  #  Etiology- likely menorrhagia-improved-  on megace; IUD; Gynecology [[facility]].   # DISPOSITION: # NO Venofer today # follow up in 6 months; MD ;labs- cbc/bmp; iron studies/ferritin; possible venofer Dr.B     All questions were answered. The patient knows to call the clinic with any problems, questions or concerns.      Cammie Sickle, MD 08/13/2020 3:43 PM

## 2020-08-13 NOTE — Progress Notes (Signed)
Patient had IUD placed several weeks ago and the bleeding has almost stopped.

## 2020-08-13 NOTE — Assessment & Plan Note (Addendum)
#  Severe anemia hemoglobin 8.3 [ferritin 5 saturation 1%]-likely secondary to menorrhagia [see below].  Continue p.o. iron.  S/p iron infusion hemoglobin improved at 12.0; HOLD iron infusion-especially as menorrhagia is improved; as patient on p.o. iron.  # Etiology- likely menorrhagia-improved-  on megace; IUD; Gynecology [[facility]].   # DISPOSITION: # NO Venofer today # follow up in 6 months; MD ;labs- cbc/bmp; iron studies/ferritin; possible venofer Dr.B

## 2020-09-09 MED FILL — MEGESTROL 40 MG TABLET: 40 | 45 days supply | Qty: 45 | Fill #1

## 2020-10-24 IMAGING — DX DG KNEE COMPLETE 4+V*L*
4 series · 4 of 4 positions shown · non-contrast
Comparison: None.

CLINICAL DATA: Left knee pain and swelling for the past month. No
injury.

EXAM:
LEFT KNEE - COMPLETE 4+ VIEW

[knee ap]
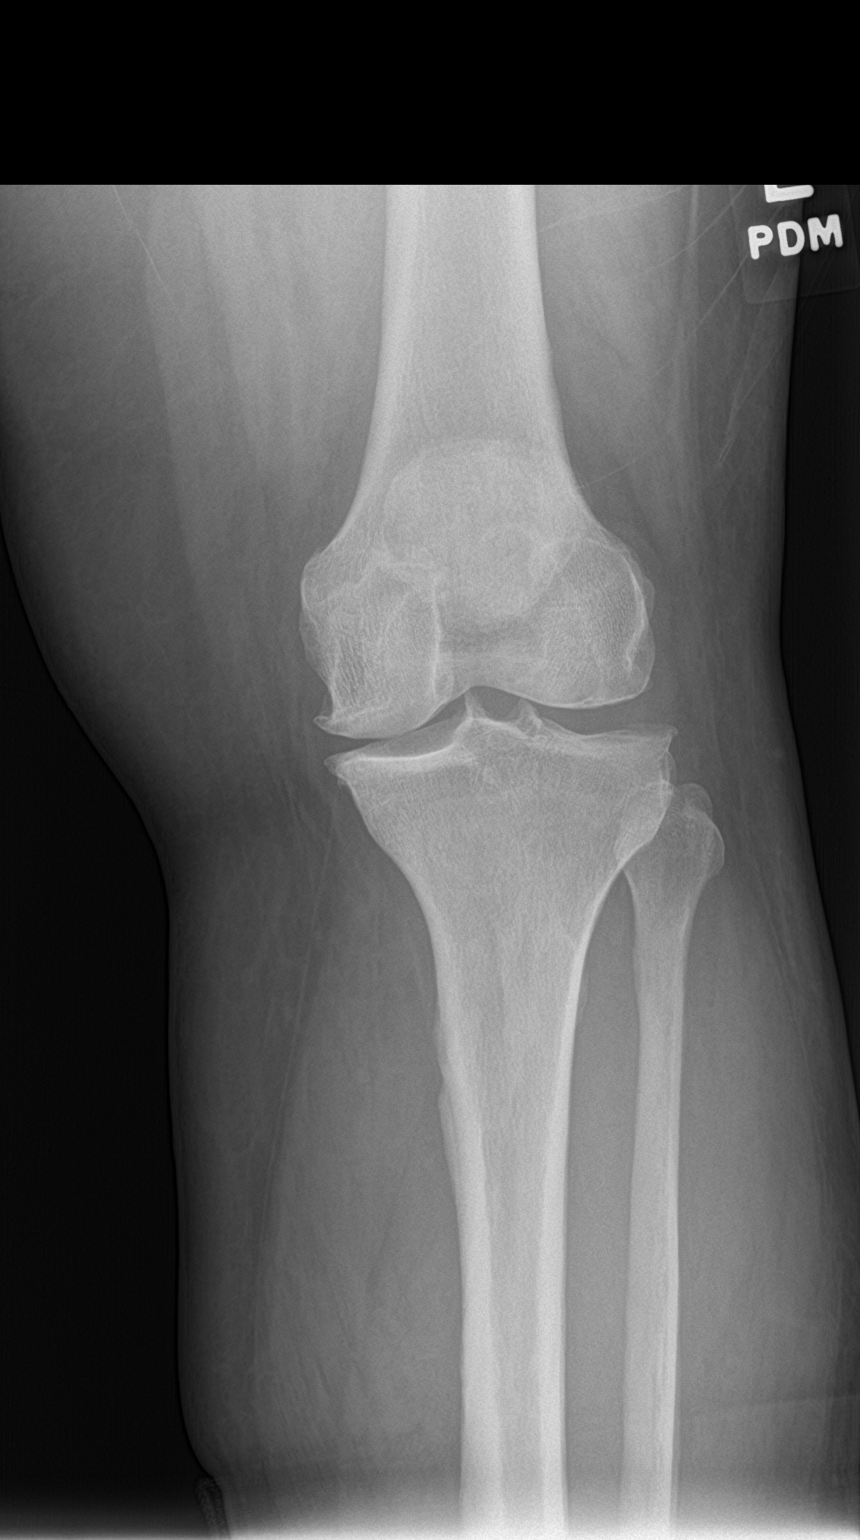

[knee tunnel]
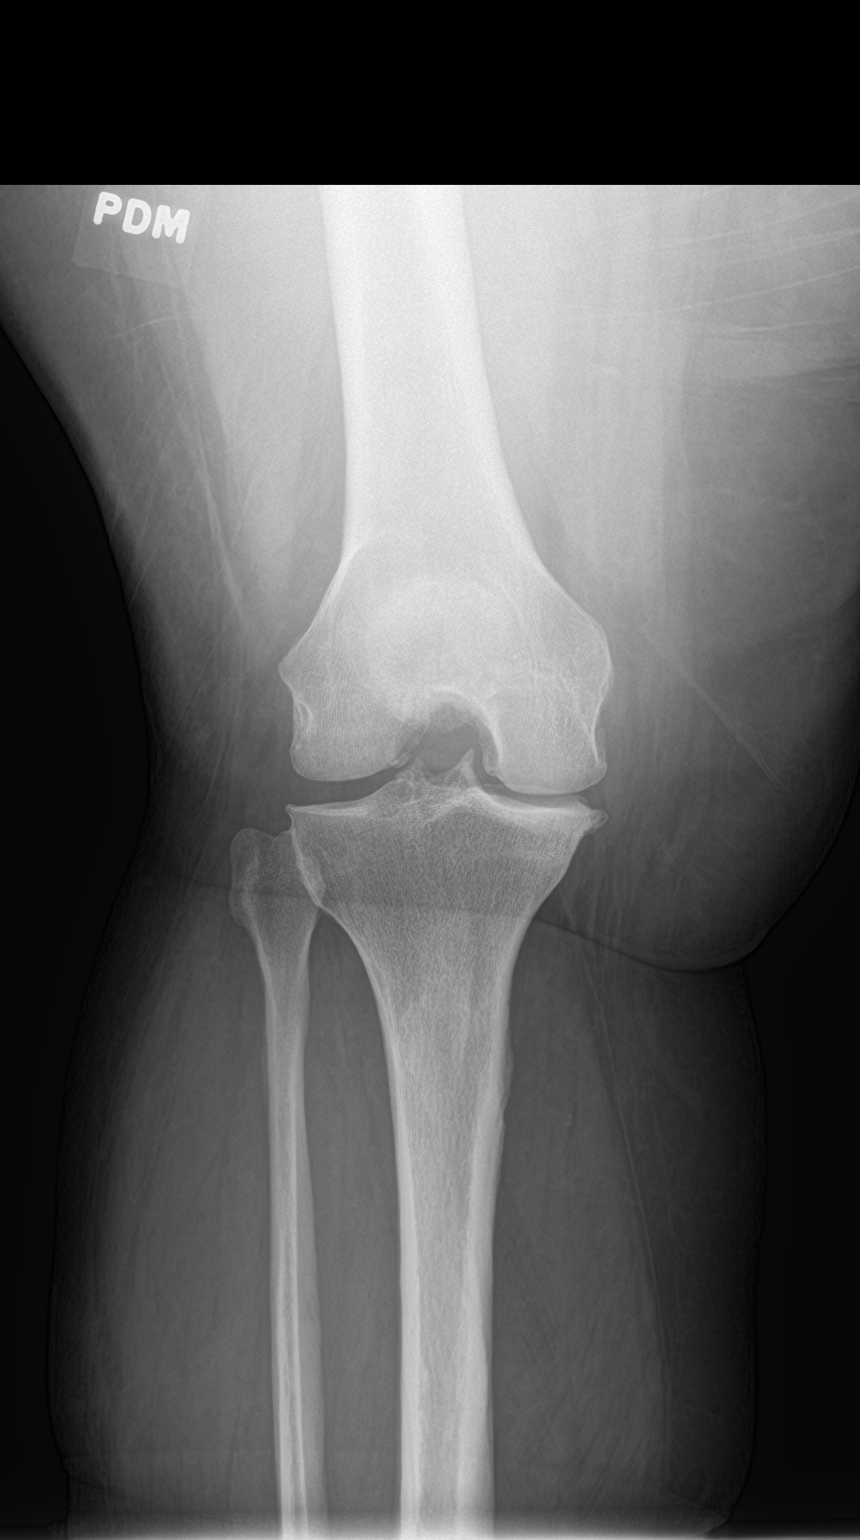

[knee lat]
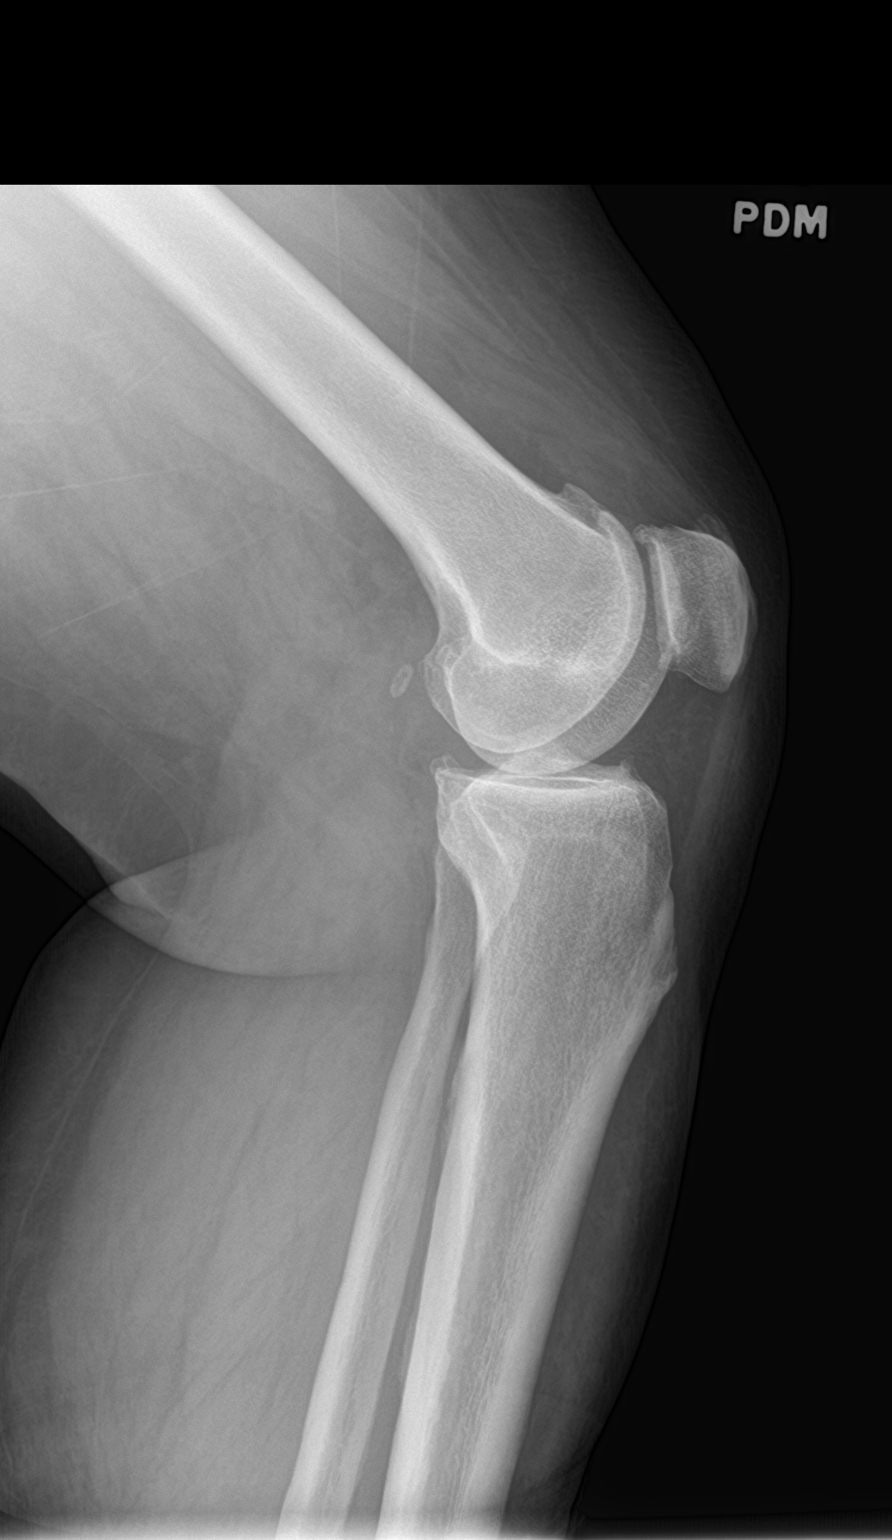

[sunrise]
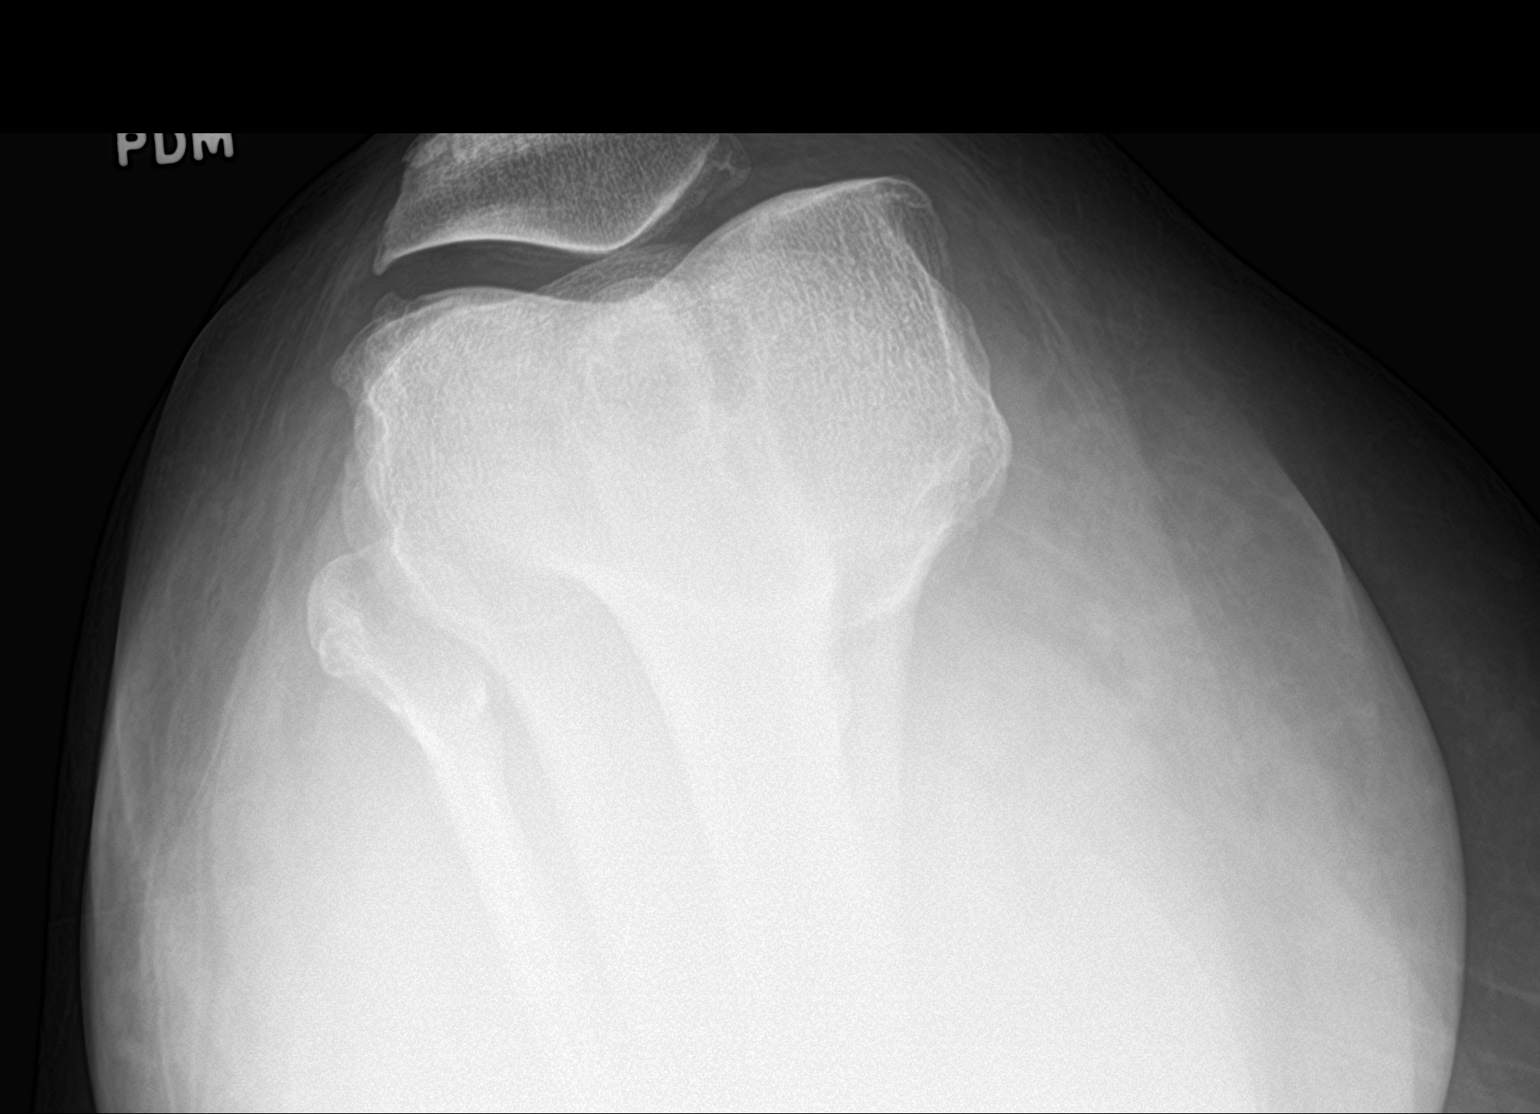

[4 of 4 positions shown; findings below may reference images not displayed]

FINDINGS: No acute fracture or dislocation. Trace joint effusion. Mild medial
compartment joint space narrowing. Small tricompartmental marginal
osteophytes. Bone mineralization is normal. Soft tissues are
unremarkable.
IMPRESSION: 1. Mild tricompartmental osteoarthritis.

## 2020-12-11 ENCOUNTER — Other Ambulatory Visit (HOSPITAL_COMMUNITY): Payer: Self-pay

## 2020-12-18 ENCOUNTER — Other Ambulatory Visit: Payer: Self-pay

## 2020-12-18 DIAGNOSIS — I10 Essential (primary) hypertension: Secondary | ICD-10-CM

## 2020-12-18 MED ORDER — AMLODIPINE BESYLATE 10 MG PO TABS: ORAL_TABLET | Freq: Every day | ORAL | 2 refills | Status: AC

## 2021-01-04 ENCOUNTER — Other Ambulatory Visit: Payer: Self-pay | Admitting: Obstetrics & Gynecology

## 2021-02-02 ENCOUNTER — Encounter: Payer: Self-pay | Admitting: Internal Medicine

## 2021-02-09 ENCOUNTER — Telehealth: Payer: Self-pay | Admitting: Internal Medicine

## 2021-02-09 ENCOUNTER — Other Ambulatory Visit (HOSPITAL_COMMUNITY): Payer: Self-pay

## 2021-02-09 NOTE — Telephone Encounter (Signed)
Patient called to r/s her appointments because her insurance doesn't start until 02/16/21. Patient rescheduled to 8/10.

## 2021-02-11 ENCOUNTER — Inpatient Hospital Stay: Payer: Self-pay

## 2021-02-11 ENCOUNTER — Inpatient Hospital Stay: Payer: Self-pay | Admitting: Internal Medicine

## 2021-02-21 ENCOUNTER — Other Ambulatory Visit (HOSPITAL_COMMUNITY): Payer: Self-pay

## 2021-02-25 ENCOUNTER — Inpatient Hospital Stay: Payer: Self-pay

## 2021-02-25 ENCOUNTER — Inpatient Hospital Stay: Payer: Self-pay | Admitting: Internal Medicine

## 2021-03-05 ENCOUNTER — Other Ambulatory Visit: Payer: Self-pay | Admitting: Family Medicine

## 2021-03-05 DIAGNOSIS — I10 Essential (primary) hypertension: Secondary | ICD-10-CM

## 2021-06-09 ENCOUNTER — Other Ambulatory Visit: Payer: Self-pay | Admitting: Obstetrics & Gynecology

## 2021-10-03 ENCOUNTER — Other Ambulatory Visit: Payer: Self-pay | Admitting: Obstetrics & Gynecology

## 2022-02-03 ENCOUNTER — Encounter: Payer: Self-pay | Admitting: Internal Medicine

## 2022-03-02 ENCOUNTER — Ambulatory Visit: Payer: Managed Care, Other (non HMO) | Admitting: Obstetrics & Gynecology

## 2022-04-03 ENCOUNTER — Other Ambulatory Visit: Payer: Self-pay | Admitting: Obstetrics & Gynecology

## 2022-10-10 ENCOUNTER — Other Ambulatory Visit: Payer: Self-pay | Admitting: Obstetrics & Gynecology
